# Patient Record
Sex: Male | Born: 1975 | Race: White | Hispanic: No | Marital: Married | Smoking: Current some day smoker
Health system: Southern US, Community
[De-identification: ages and names within clinical notes are randomized; demographics above are authoritative.]

## PROBLEM LIST (undated history)

## (undated) HISTORY — PX: HERNIA REPAIR: SHX51

## (undated) HISTORY — PX: WISDOM TOOTH EXTRACTION: SHX21

---

## 2012-05-08 ENCOUNTER — Ambulatory Visit: Payer: Managed Care, Other (non HMO) | Admitting: Family Medicine

## 2012-05-08 DIAGNOSIS — J45909 Unspecified asthma, uncomplicated: Secondary | ICD-10-CM

## 2012-05-08 MED ORDER — ALBUTEROL SULFATE HFA 108 (90 BASE) MCG/ACT IN AERS: 2.0000 | INHALATION_SPRAY | Freq: Four times a day (QID) | RESPIRATORY_TRACT | Status: DC | PRN

## 2012-05-08 NOTE — Progress Notes (Signed)
  [facility]:  Office Visit  Chief Complaint:  Chief Complaint  Patient presents with  . Shortness of Breath    pt out of his emergency inhaler.  got very SOB after a run today    HPI: Colin Rodriguez is a 36 y.o. male who complains of  1 episode of SOB for  20 min after working out at gym today, he has sporadic SOB with exercise in the last week as well. Took zyrtec without relief. Also has a h/o asthma , has not been on meds since junior high school.   Past Medical History  Diagnosis Date  . Asthma    History reviewed. No pertinent past surgical history. History   Social History  . Marital Status: Married    Spouse Name: N/A    Number of Children: N/A  . Years of Education: N/A   Social History Main Topics  . Smoking status: Former Games developer  . Smokeless tobacco: None  . Alcohol Use: Yes  . Drug Use: No  . Sexually Active: None   Other Topics Concern  . None   Social History Narrative  . None   Family History  Problem Relation Age of Onset  . Hypertension Mother    No Known Allergies Prior to Admission medications   Medication Sig Start Date End Date Taking? Authorizing Provider  albuterol (PROVENTIL HFA;VENTOLIN HFA) 108 (90 BASE) MCG/ACT inhaler Inhale 2 puffs into the lungs every 6 (six) hours as needed.    Historical Provider, MD     ROS: The patient denies fevers, chills, night sweats, unintentional weight loss, chest pain, palpitations,  nausea, vomiting, abdominal pain, dysuria, hematuria, melena, numbness, weakness, or tingling.  All other systems have been reviewed and were otherwise negative with the exception of those mentioned in the HPI and as above.    PHYSICAL EXAM: Filed Vitals:   05/08/12 1301  BP: 112/70  Pulse: 73  Temp: 98 F (36.7 C)  Resp: 16   Filed Vitals:   05/08/12 1301  Height: 6' (1.829 m)  Weight: 195 lb (88.451 kg)   Body mass index is 26.45 kg/(m^2).  General: Alert, no acute distress HEENT:   Normocephalic, atraumatic, oropharynx patent.  Cardiovascular:  Regular rate and rhythm, no rubs murmurs or gallops.  No Carotid bruits, radial pulse intact. No pedal edema.  Respiratory: Clear to auscultation bilaterally.  No wheezes, rales, or rhonchi.  No cyanosis, no use of accessory musculature GI: No organomegaly, abdomen is soft and non-tender, positive bowel sounds.  No masses. Skin: No rashes. Neurologic: Facial musculature symmetric. Psychiatric: Patient is appropriate throughout our interaction. Lymphatic: No cervical lymphadenopathy Musculoskeletal: Gait intact.   LABS: No results found for this or any previous visit.   EKG/XRAY:   Primary read interpreted by Dr. Conley Rolls at [facility].   ASSESSMENT/PLAN: Encounter Diagnosis  Name Primary?  Marland Kitchen Asthma Yes   Rx: Albuterol INH prn when exercising Continue with OTC allergy medications. Recommend f/u if use INH more frequently  For consideration of maintenance meds    Trysten Berti PHUONG, DO 05/08/2012 2:23 PM

## 2012-09-18 ENCOUNTER — Emergency Department (HOSPITAL_COMMUNITY): Payer: Managed Care, Other (non HMO)

## 2012-09-18 ENCOUNTER — Encounter (HOSPITAL_COMMUNITY): Payer: Self-pay | Admitting: *Deleted

## 2012-09-18 ENCOUNTER — Emergency Department (HOSPITAL_COMMUNITY)
Admission: EM | Admit: 2012-09-18 | Discharge: 2012-09-18 | Disposition: A | Discharge status: Home / Self Care | Payer: Managed Care, Other (non HMO) | Attending: Emergency Medicine | Admitting: Emergency Medicine

## 2012-09-18 DIAGNOSIS — H538 Other visual disturbances: Secondary | ICD-10-CM | POA: Insufficient documentation

## 2012-09-18 DIAGNOSIS — R51 Headache: Secondary | ICD-10-CM | POA: Insufficient documentation

## 2012-09-18 DIAGNOSIS — J45909 Unspecified asthma, uncomplicated: Secondary | ICD-10-CM | POA: Insufficient documentation

## 2012-09-18 MED ORDER — ASPIRIN-ACETAMINOPHEN-CAFFEINE 250-250-65 MG PO TABS
1.0000 | ORAL_TABLET | Freq: Once | ORAL | Status: DC
  Filled 2012-09-18 (×2): qty 1

## 2012-09-18 NOTE — ED Notes (Signed)
Seen by MD and code stroke cancelled.  Patient was taken to CT from [facility].  Code stroke team present for same.

## 2012-09-18 NOTE — ED Notes (Signed)
Patient was at gym working out and had vision changes to right eye and headache on right side.  It then moved to a left sided headache.  No neuro deficits at EMS arrival

## 2012-09-18 NOTE — ED Provider Notes (Signed)
History     CSN: [id number]  Arrival date & time 09/18/12  1353   First MD Initiated Contact with Patient 09/18/12 1514      Chief Complaint  Patient presents with  . Headache    (Consider location/radiation/quality/duration/timing/severity/associated sxs/prior treatment) The history is provided by the patient.  Colin Rodriguez is a 36 y.o. male hx of asthma here with headache, blurry vision. He finished push ups around 12:15pm. Then he developed blurry vision on R eye that resolved. He also had gradual L sided headache that is improving. Headache is not sudden or thunderclap, not worse headache. No hx of migraines and no personal or family hx of aneurysms. He went to urgent care, and was sent here to r/o stroke. Stroke code canceled by stroke team and Dr. Rennis Chris prior to me seeing the patient.    Past Medical History  Diagnosis Date  . Asthma     History reviewed. No pertinent past surgical history.  Family History  Problem Relation Age of Onset  . Hypertension Mother     History  Substance Use Topics  . Smoking status: Former Games developer  . Smokeless tobacco: Not on file  . Alcohol Use: Yes      Review of Systems  HENT:       Blurry vision  Neurological: Positive for headaches.  All other systems reviewed and are negative.    Allergies  Review of patient's allergies indicates no known allergies.  Home Medications   Current Outpatient Rx  Name Route Sig Dispense Refill  . ALBUTEROL SULFATE HFA 108 (90 BASE) MCG/ACT IN AERS Inhalation Inhale 2 puffs into the lungs every 6 (six) hours as needed. 1 Inhaler 5  . CYCLOBENZAPRINE HCL 10 MG PO TABS Oral Take 10 mg by mouth daily as needed. For pain    . IBUPROFEN 200 MG PO TABS Oral Take 600 mg by mouth every 6 (six) hours as needed. For pain      BP 109/69  Pulse 53  Temp 98.3 F (36.8 C) (Oral)  Resp 16  SpO2 98%  Physical Exam  Nursing note and vitals reviewed. Constitutional: He is oriented to  person, place, and time. He appears well-developed and well-nourished.       NAD, calm  HENT:  Head: Normocephalic.  Mouth/Throat: Oropharynx is clear and moist.  Eyes: Conjunctivae normal are normal. Pupils are equal, round, and reactive to light.       Vision 20/20 bilaterally  Neck: Normal range of motion. Neck supple.  Cardiovascular: Normal rate.   Pulmonary/Chest: Effort normal and breath sounds normal.  Abdominal: Soft. Bowel sounds are normal.  Musculoskeletal: Normal range of motion. He exhibits no edema.  Neurological: He is alert and oriented to person, place, and time. No cranial nerve deficit. Coordination normal.       No pronator drift, nl speech, nl finger to nose  Skin: Skin is warm and dry.  Psychiatric: He has a normal mood and affect. His behavior is normal. Judgment and thought content normal.    ED Course  Procedures (including critical care time)  Labs Reviewed - No data to display Ct Head Wo Contrast  09/18/2012  *RADIOLOGY REPORT*  Clinical Data: Code stroke, vision changes, headache  CT HEAD WITHOUT CONTRAST  Technique:  Contiguous axial images were obtained from the base of the skull through the vertex without contrast.  Comparison: None.  Findings: No skull fracture is noted.  No intracranial hemorrhage, mass effect or midline shift.  Paranasal sinuses and mastoid air cells are unremarkable.  No definite acute cortical infarction.  No mass lesion is noted on this unenhanced scan.  The gray and white matter differentiation is preserved.  No intra or extra-axial fluid collection.  IMPRESSION:  No acute intracranial abnormality.  No definite acute cortical infarction.  I discussed with Dr.Jacubowitz   Original Report Authenticated By: Natasha Mead, M.D.      No diagnosis found.    MDM  Colin Rodriguez is a 36 y.o. male here with HA and blurry vision today. Patient is low risk for [facility] or aneurysms or stroke. He is likely to have complicated migraine now only has  mild headache and only wants PO meds. CT head showed no infarct or bleed. I counseled patient to take excedrin headache and follow up with his doctor. Return precautions given.       Richardean Canal, MD 09/18/12 1536

## 2012-09-21 ENCOUNTER — Other Ambulatory Visit (HOSPITAL_COMMUNITY): Payer: Self-pay | Admitting: Cardiology

## 2012-12-11 ENCOUNTER — Ambulatory Visit (INDEPENDENT_AMBULATORY_CARE_PROVIDER_SITE_OTHER): Payer: Managed Care, Other (non HMO) | Admitting: Emergency Medicine

## 2012-12-11 VITALS — BP 110/70 | HR 60 | Temp 97.6°F | Resp 16 | Ht 72.0 in | Wt 177.8 lb

## 2012-12-11 DIAGNOSIS — Z0000 Encounter for general adult medical examination without abnormal findings: Secondary | ICD-10-CM

## 2012-12-11 DIAGNOSIS — J45909 Unspecified asthma, uncomplicated: Secondary | ICD-10-CM

## 2012-12-11 DIAGNOSIS — E559 Vitamin D deficiency, unspecified: Secondary | ICD-10-CM

## 2012-12-11 LAB — POCT URINALYSIS DIPSTICK
Bilirubin, UA: NEGATIVE
Glucose, UA: NEGATIVE
Ketones, UA: NEGATIVE
Protein, UA: NEGATIVE
Spec Grav, UA: >=1.03

## 2012-12-11 LAB — COMPREHENSIVE METABOLIC PANEL
Albumin: 5.2 g/dL (ref 3.5–5.2)
CO2: 27 mEq/L (ref 19–32)
Calcium: 9.8 mg/dL (ref 8.4–10.5)
Chloride: 104 mEq/L (ref 96–112)
Glucose, Bld: 76 mg/dL (ref 70–99)
Potassium: 4.3 mEq/L (ref 3.5–5.3)
Sodium: 141 mEq/L (ref 135–145)
Total Bilirubin: 1 mg/dL (ref 0.3–1.2)
Total Protein: 7.4 g/dL (ref 6.0–8.3)

## 2012-12-11 LAB — POCT CBC
Hemoglobin: 16.3 g/dL (ref 14.1–18.1)
Lymph, poc: 2.2 (ref 0.6–3.4)
MCH, POC: 29.6 pg (ref 27–31.2)
MCHC: 31.6 g/dL — AB (ref 31.8–35.4)
MID (cbc): 0.6 (ref 0–0.9)
MPV: 10.5 fL (ref 0–99.8)
POC Granulocyte: 4 (ref 2–6.9)
POC LYMPH PERCENT: 32 %L (ref 10–50)
POC MID %: 9.4 %M (ref 0–12)
RDW, POC: 13.3 %
WBC: 6.8 10*3/uL (ref 4.6–10.2)

## 2012-12-11 LAB — POCT UA - MICROSCOPIC ONLY
Casts, Ur, LPF, POC: NEGATIVE
Mucus, UA: NEGATIVE
WBC, Ur, HPF, POC: NEGATIVE
Yeast, UA: NEGATIVE

## 2012-12-11 LAB — TSH: TSH: 1.654 u[IU]/mL (ref 0.350–4.500)

## 2012-12-11 LAB — LIPID PANEL: LDL Cholesterol: 92 mg/dL (ref 0–99)

## 2012-12-11 NOTE — Progress Notes (Signed)
[facility] [address], [place] [address] [phone]  Date:  12/11/2012   Name:  Colin Rodriguez   DOB:  12-Apr-1976   MRN:  [id number]  PCP:  Tally Due, MD    Chief Complaint: Annual Exam   History of Present Illness:  Colin Rodriguez is a 36 y.o. very pleasant male patient who presents with the following:  For general physical exam and labs.  History of asthma and has not used an inhaler since junior high school until recently.  Experiences wheezing with exercise rarely and more commonly while experiencing asthma symptoms.  Denies current asthma, wheezing or shortness of breath.  No allergy or URI symptoms.  Recent dietary loss of 25 pounds.  Began to workout regularly and run  There is no problem list on file for this patient.   Past Medical History  Diagnosis Date  . Asthma     No past surgical history on file.  History  Substance Use Topics  . Smoking status: Former Games developer  . Smokeless tobacco: Not on file  . Alcohol Use: Yes    Family History  Problem Relation Age of Onset  . Hypertension Mother   . Cancer Maternal Grandmother     No Known Allergies  Medication list has been reviewed and updated.  Current Outpatient Prescriptions on File Prior to Visit  Medication Sig Dispense Refill  . albuterol (PROVENTIL HFA;VENTOLIN HFA) 108 (90 BASE) MCG/ACT inhaler Inhale 2 puffs into the lungs every 6 (six) hours as needed.  1 Inhaler  5  . cyclobenzaprine (FLEXERIL) 10 MG tablet Take 10 mg by mouth daily as needed. For pain      . ibuprofen (ADVIL,MOTRIN) 200 MG tablet Take 600 mg by mouth every 6 (six) hours as needed. For pain        Review of Systems:  As per HPI, otherwise negative.    Physical Examination: Filed Vitals:   12/11/12 0822  BP: 110/70  Pulse: 60  Temp: 97.6 F (36.4 C)  Resp: 16   Filed Vitals:   12/11/12 0822  Height: 6' (1.829 m)  Weight: 177 lb 12.8 oz (80.65 kg)   Body mass index is  24.11 kg/(m^2). Ideal Body Weight: Weight in (lb) to have BMI = 25: 183.9   GEN: WDWN, NAD, Non-toxic, A & O x 3  No rash or sepsis or shortness of breath HEENT: Atraumatic, Normocephalic. Neck supple. No masses, No LAD.  Oropharynx and floor of mouth negative Ears and Nose: No external deformity.  TM negative Neck:  No masses or thyromegaly CV: RRR, No M/G/R. No JVD. No thrill. No extra heart sounds. PULM: CTA B, no wheezes, crackles, rhonchi. No retractions. No resp. distress. No accessory muscle use. ABD: S, NT, ND, +BS. No rebound. No HSM. EXTR: No c/c/e NEURO Normal gait.  PSYCH: Normally interactive. Conversant. Not depressed or anxious appearing.  Calm demeanor.    Assessment and Plan: Wellness screening Labs  Follow up as needed  Carmelina Dane, MD  Results for orders placed in visit on 12/11/12  POCT CBC      Component Value Range   WBC 6.8  4.6 - 10.2 K/uL   Lymph, poc 2.2  0.6 - 3.4   POC LYMPH PERCENT 32.0  10 - 50 %L   MID (cbc) 0.6  0 - 0.9   POC MID % 9.4  0 - 12 %M   POC Granulocyte 4.0  2 - 6.9  Granulocyte percent 58.6  37 - 80 %G   RBC 5.50  4.69 - 6.13 M/uL   Hemoglobin 16.3  14.1 - 18.1 g/dL   HCT, POC 40.9  81.1 - 53.7 %   MCV 93.8  80 - 97 fL   MCH, POC 29.6  27 - 31.2 pg   MCHC 31.6 (*) 31.8 - 35.4 g/dL   RDW, POC 91.4     Platelet Count, POC 289  142 - 424 K/uL   MPV 10.5  0 - 99.8 fL  POCT UA - MICROSCOPIC ONLY      Component Value Range   WBC, Ur, HPF, POC neg     RBC, urine, microscopic neg     Bacteria, U Microscopic neg     Mucus, UA neg     Epithelial cells, urine per micros neg     Crystals, Ur, HPF, POC neg     Casts, Ur, LPF, POC neg     Yeast, UA neg    POCT URINALYSIS DIPSTICK      Component Value Range   Color, UA yellow     Clarity, UA clear     Glucose, UA neg     Bilirubin, UA neg     Ketones, UA neg     Spec Grav, UA >=1.030     Blood, UA neg     pH, UA 5.5     Protein, UA neg     Urobilinogen, UA 0.2      Nitrite, UA neg     Leukocytes, UA Negative    COMPREHENSIVE METABOLIC PANEL      Component Value Range   Sodium 141  135 - 145 mEq/L   Potassium 4.3  3.5 - 5.3 mEq/L   Chloride 104  96 - 112 mEq/L   CO2 27  19 - 32 mEq/L   Glucose, Bld 76  70 - 99 mg/dL   BUN 23  6 - 23 mg/dL   Creat 7.82  9.56 - 2.13 mg/dL   Total Bilirubin 1.0  0.3 - 1.2 mg/dL   Alkaline Phosphatase 93  39 - 117 U/L   AST 22  0 - 37 U/L   ALT 19  0 - 53 U/L   Total Protein 7.4  6.0 - 8.3 g/dL   Albumin 5.2  3.5 - 5.2 g/dL   Calcium 9.8  8.4 - 08.6 mg/dL  LIPID PANEL      Component Value Range   Cholesterol 164  0 - 200 mg/dL   Triglycerides 56  <578 mg/dL   HDL 61  >46 mg/dL   Total CHOL/HDL Ratio 2.7     VLDL 11  0 - 40 mg/dL   LDL Cholesterol 92  0 - 99 mg/dL  TSH      Component Value Range   TSH 1.654  0.350 - 4.500 uIU/mL  VITAMIN D 25 HYDROXY      Component Value Range   Vit D, 25-Hydroxy 24 (*) 30 - 89 ng/mL

## 2012-12-12 DIAGNOSIS — J45909 Unspecified asthma, uncomplicated: Secondary | ICD-10-CM | POA: Insufficient documentation

## 2012-12-12 DIAGNOSIS — E559 Vitamin D deficiency, unspecified: Secondary | ICD-10-CM | POA: Insufficient documentation

## 2012-12-12 LAB — VITAMIN D 25 HYDROXY (VIT D DEFICIENCY, FRACTURES): Vit D, 25-Hydroxy: 24 ng/mL — ABNORMAL LOW (ref 30–89)

## 2012-12-12 MED ORDER — ERGOCALCIFEROL 1.25 MG (50000 UT) PO CAPS: 50000.0000 [IU] | ORAL_CAPSULE | ORAL | Status: DC

## 2013-07-07 ENCOUNTER — Ambulatory Visit (INDEPENDENT_AMBULATORY_CARE_PROVIDER_SITE_OTHER): Payer: Managed Care, Other (non HMO) | Admitting: Family Medicine

## 2013-07-07 ENCOUNTER — Ambulatory Visit: Payer: Managed Care, Other (non HMO)

## 2013-07-07 VITALS — BP 110/80 | HR 60 | Temp 98.1°F | Resp 16 | Ht 72.0 in | Wt 184.0 lb

## 2013-07-07 DIAGNOSIS — R05 Cough: Secondary | ICD-10-CM

## 2013-07-07 DIAGNOSIS — J40 Bronchitis, not specified as acute or chronic: Secondary | ICD-10-CM

## 2013-07-07 MED ORDER — AZITHROMYCIN 250 MG PO TABS: ORAL_TABLET | ORAL | Status: DC

## 2013-07-07 MED ORDER — ALBUTEROL SULFATE HFA 108 (90 BASE) MCG/ACT IN AERS: 2.0000 | INHALATION_SPRAY | RESPIRATORY_TRACT | Status: DC | PRN

## 2013-07-07 MED ORDER — HYDROCODONE-HOMATROPINE 5-1.5 MG/5ML PO SYRP: ORAL_SOLUTION | ORAL | Status: DC

## 2013-07-07 NOTE — Progress Notes (Signed)
Subjective:    Patient ID: Colin Rodriguez, male    DOB: 1976-04-30, 37 y.o.   MRN: [id number]  Chief Complaint  Patient presents with  . Cough    productive * 1 week  . Fatigue  . Chills    HPI Colin Rodriguez is a 37 y.o. male presenting for cough, fatigue, chills for one week.  His cough is productive with green sputum which is worse in the morning.  He also feels feverish, has chills, and complains of joint pain.       He recalls having a cold 1 1/2 week ago that resolved.  It occurred immediately after having a choking episode with a cracker.  He is concerned that he inhaled this cracker into his lungs.  He also had a stomach bug over the week that went through his family - this has resolved.  He has been taking ibuprofen with some relief.    Cough productive of green sputum. No SOB or wheezing.   History of asthma.  Has never had an attack.  Has not used his albuterol inhaler in one year.  Recently quit smoking at the end of May.  He has quit "several times" in the past.  No sick contacts.  No known tick bites.    Patient is getting ready to go on a beach trip.  Prior to Admission medications   Medication Sig Start Date End Date Taking? Authorizing Provider  ibuprofen (ADVIL,MOTRIN) 200 MG tablet Take 600 mg by mouth every 6 (six) hours as needed. For pain   Yes Historical Provider, MD                cyclobenzaprine (FLEXERIL) 10 MG tablet Take 10 mg by mouth daily as needed. For pain    Historical Provider, MD  ergocalciferol (VITAMIN D2) 50000 UNITS capsule Take 1 capsule (50,000 Units total) by mouth once a week. 12/12/12   Phillips Odor, MD          No Known Allergies  Review of Systems  Constitutional: Positive for chills and fatigue. Negative for fever, appetite change and unexpected weight change.  HENT: Positive for sneezing. Negative for hearing loss, ear pain, congestion, sore throat, rhinorrhea, trouble swallowing, postnasal drip, sinus pressure and  tinnitus.   Respiratory: Positive for cough (productive, green sputum), shortness of breath and wheezing. Negative for chest tightness.   Cardiovascular: Negative for chest pain.  Gastrointestinal: Negative for nausea, vomiting, abdominal pain, diarrhea and constipation.  Musculoskeletal: Positive for myalgias and arthralgias. Negative for joint swelling.  Neurological: Negative for dizziness, light-headedness and headaches.      Objective:   Physical Exam Filed Vitals:   07/07/13 1208  BP: 110/80  Pulse: 60  Temp: 98.1 F (36.7 C)  TempSrc: Oral  Resp: 16  Height: 6' (1.829 m)  Weight: 184 lb (83.462 kg)  SpO2: 99%   General:  WDWN male in no acute distress. Skin:  Soft, warm skin with good turgor.  No bruising or lesions present.  HEENT:   Head - normocephalic, no visible or palpable masses.  Eyes - conjunctivae clear, sclera white, PERRL.    Ears - EACs clear.  TMs are translucent and mobile with some sclerosing present.  Hearing intact.   Nose - Patent.  Nasal mucosa is non-inflamed.  Septum midline.   Mouth/Throat - Mucous membranes are moist and pink.  No obvious periodontal disease.   No tonsillar hypertrophy or exudate.   Neck - supple without lymphadenopathy or thyromegaly .  Cardiovascular: S1 and S2 distinct with no murmurs, rubs or gallops. Respiratory:  CTA with no adventitious sounds.   CXR: [facility] reading (PRIMARY) by  Dr. Patsy Lager.  DG Chest 2 View; Future. Mild hyperinflation otherwise clear.      Assessment & Plan:  1. Cough 2. Bronchitis, not specified as acute or chronic  Discussed results of x-ray with patient.  Praised patient for smoking cessation and encouraged him to continue this time.  Keep hydrated.  Call office if symptoms do not steadily improve or any acute worsening.  Prescribe: - azithromycin (ZITHROMAX Z-PAK) 250 MG tablet; 2 tabs po first day, then 1 tab po next 4 days  Dispense: 6 tablet; Refill: 0 - HYDROcodone-homatropine (HYCODAN)  5-1.5 MG/5ML syrup; 1 TSP PO Q 4-6 HOURS PRN COUGH  Dispense: 120 mL; Refill: 0 - albuterol (PROVENTIL HFA;VENTOLIN HFA) 108 (90 BASE) MCG/ACT inhaler; Inhale 2 puffs into the lungs every 4 (four) hours as needed for wheezing.  Dispense: 1 Inhaler; Refill: 1   Seen and examined with PA student.   Eula Listen, PA-C 07/07/2013 5:10 PM

## 2013-10-19 ENCOUNTER — Ambulatory Visit: Payer: Managed Care, Other (non HMO) | Admitting: Internal Medicine

## 2013-10-19 ENCOUNTER — Ambulatory Visit: Payer: Managed Care, Other (non HMO)

## 2013-10-19 VITALS — BP 120/70 | HR 60 | Temp 97.8°F | Resp 18 | Ht 71.5 in | Wt 190.0 lb

## 2013-10-19 DIAGNOSIS — R079 Chest pain, unspecified: Secondary | ICD-10-CM

## 2013-10-19 DIAGNOSIS — Z23 Encounter for immunization: Secondary | ICD-10-CM

## 2013-10-19 LAB — POCT CBC
HCT, POC: 46.6 % (ref 43.5–53.7)
Hemoglobin: 15 g/dL (ref 14.1–18.1)
Lymph, poc: 2.1 (ref 0.6–3.4)
MCH, POC: 30.4 pg (ref 27–31.2)
MCHC: 32.2 g/dL (ref 31.8–35.4)
WBC: 6.2 10*3/uL (ref 4.6–10.2)

## 2013-10-19 LAB — POCT SEDIMENTATION RATE: POCT SED RATE: 3 mm/hr (ref 0–22)

## 2013-10-19 MED ORDER — GI COCKTAIL ~~LOC~~: 30.0000 mL | Freq: Once | ORAL | Status: AC

## 2013-10-19 MED ORDER — OMEPRAZOLE 40 MG PO CPDR: 40.0000 mg | DELAYED_RELEASE_CAPSULE | Freq: Every day | ORAL | Status: DC

## 2013-10-19 NOTE — Progress Notes (Signed)
  Subjective:    Patient ID: Colin Rodriguez, male    DOB: November 21, 1976, 37 y.o.   MRN: [id number]  HPI Pt here for chest pain that started 2 weeks ago on/off. Pain more on the right side, but have moved to the left side. Patient states he has been feeling like he is fighting a virus and has had a headache for 3 days. He has taken Ibuprofen and excedrin for the headaches. Had body aches and fever last week and his wife has it this week. Exercises a lot and the chest pain was no worse during exercise, no diaphoresis, no weakness or syncope. No hx for gerd. No fhx of CAD  Review of Systems healthy    Objective:   Physical Exam  Vitals reviewed. Constitutional: He is oriented to person, place, and time. He appears well-developed and well-nourished. No distress.  HENT:  Head: Normocephalic.  Right Ear: External ear normal.  Left Ear: External ear normal.  Nose: Nose normal.  Mouth/Throat: Oropharynx is clear and moist.  Eyes: Conjunctivae and EOM are normal. Pupils are equal, round, and reactive to light. No scleral icterus.  Neck: Normal range of motion. Neck supple. No tracheal deviation present. No thyromegaly present.  Cardiovascular: Normal rate, normal heart sounds and intact distal pulses.   Pulmonary/Chest: Effort normal and breath sounds normal.  Abdominal: Soft. He exhibits no mass. There is no tenderness.  Musculoskeletal: Normal range of motion.  Neurological: He is alert and oriented to person, place, and time. No cranial nerve deficit. He exhibits normal muscle tone.  Psychiatric: His speech is normal and behavior is normal. Thought content normal. His mood appears anxious. Cognition and memory are normal.   EKG bradycardia/ no ischemia/athletic heart  [facility] reading (PRIMARY) by  Dr Perrin Maltese normal cxr       Assessment & Plan:  Chest pain/Quit smoking Hold nsaids Trial prilosec/Tylenol for pain

## 2013-10-19 NOTE — Patient Instructions (Addendum)
Influenza Vaccine (Flu Vaccine, Inactivated) 2013 2014 What You Need to Know WHY GET VACCINATED?  Influenza ("flu") is a contagious disease that spreads around the [place] every winter, usually between October and May.  Flu is caused by the influenza virus, and can be spread by coughing, sneezing, and close contact.  Anyone can get flu, but the risk of getting flu is highest among children. Symptoms come on suddenly and may last several days. They can include:  Fever or chills.  Sore throat.  Muscle aches.  Fatigue.  Cough.  Headache.  Runny or stuffy nose. Flu can make some people much sicker than others. These people include young children, people 65 and older, pregnant women, and people with certain health conditions such as heart, lung or kidney disease, or a weakened immune system. Flu vaccine is especially important for these people, and anyone in close contact with them. Flu can also lead to pneumonia, and make existing medical conditions worse. It can cause diarrhea and seizures in children. Each year thousands of people in the [place] die from flu, and many more are hospitalized. Flu vaccine is the best protection we have from flu and its complications. Flu vaccine also helps prevent spreading flu from person to person. INACTIVATED FLU VACCINE There are 2 types of influenza vaccine:  You are getting an inactivated flu vaccine, which does not contain any live influenza virus. It is given by injection with a needle, and often called the "flu shot."  A different live, attenuated (weakened) influenza vaccine is sprayed into the nostrils. This vaccine is described in a separate Vaccine Information Statement. Flu vaccine is recommended every year. Children 6 months through 8 years of age should get 2 doses the first year they get vaccinated. Flu viruses are always changing. Each year's flu vaccine is made to protect from viruses that are most likely to cause disease  that year. While flu vaccine cannot prevent all cases of flu, it is our best defense against the disease. Inactivated flu vaccine protects against 3 or 4 different influenza viruses. It takes about 2 weeks for protection to develop after the vaccination, and protection lasts several months to a year. Some illnesses that are not caused by influenza virus are often mistaken for flu. Flu vaccine will not prevent these illnesses. It can only prevent influenza. A "high-dose" flu vaccine is available for people 65 years of age and older. The person giving you the vaccine can tell you more about it. Some inactivated flu vaccine contains a very small amount of a mercury-based preservative called thimerosal. Studies have shown that thimerosal in vaccines is not harmful, but flu vaccines that do not contain a preservative are available. SOME PEOPLE SHOULD NOT GET THIS VACCINE Tell the person who gives you the vaccine:  If you have any severe (life-threatening) allergies. If you ever had a life-threatening allergic reaction after a dose of flu vaccine, or have a severe allergy to any part of this vaccine, you may be advised not to get a dose. Most, but not all, types of flu vaccine contain a small amount of egg.  If you ever had Guillain Barr Syndrome (a severe paralyzing illness, also called GBS). Some people with a history of GBS should not get this vaccine. This should be discussed with your doctor.  If you are not feeling well. They might suggest waiting until you feel better. But you should come back. RISKS OF A VACCINE REACTION With a vaccine, like any medicine, there   is a chance of side effects. These are usually mild and go away on their own. Serious side effects are also possible, but are very rare. Inactivated flu vaccine does not contain live flu virus, sogetting flu from this vaccine is not possible. Brief fainting spells and related symptoms (such as jerking movements) can happen after any medical  procedure, including vaccination. Sitting or lying down for about 15 minutes after a vaccination can help prevent fainting and injuries caused by falls. Tell your doctor if you feel dizzy or lightheaded, or have vision changes or ringing in the ears. Mild problems following inactivated flu vaccine:  Soreness, redness, or swelling where the shot was given.  Hoarseness; sore, red or itchy eyes; or cough.  Fever.  Aches.  Headache.  Itching.  Fatigue. If these problems occur, they usually begin soon after the shot and last 1 or 2 days. Moderate problems following inactivated flu vaccine:  Young children who get inactivated flu vaccine and pneumococcal vaccine (PCV13) at the same time may be at increased risk for seizures caused by fever. Ask your doctor for more information. Tell your doctor if a child who is getting flu vaccine has ever had a seizure. Severe problems following inactivated flu vaccine:  A severe allergic reaction could occur after any vaccine (estimated less than 1 in a million doses).  There is a small possibility that inactivated flu vaccine could be associated with Guillan Barr Syndrome (GBS), no more than 1 or 2 cases per million people vaccinated. This is much lower than the risk of severe complications from flu, which can be prevented by flu vaccine. The safety of vaccines is always being monitored. For more information, visit: http://floyd.org/ WHAT IF THERE IS A SERIOUS REACTION? What should I look for?  Look for anything that concerns you, such as signs of a severe allergic reaction, very high fever, or behavior changes. Signs of a severe allergic reaction can include hives, swelling of the face and throat, difficulty breathing, a fast heartbeat, dizziness, and weakness. These would start a few minutes to a few hours after the vaccination. What should I do?  If you think it is a severe allergic reaction or other emergency that cannot wait, call [phone] 1 1   or get the person to the nearest hospital. Otherwise, call your doctor.  Afterward, the reaction should be reported to the Vaccine Adverse Event Reporting System (VAERS). Your doctor might file this report, or you can do it yourself through the VAERS website at www.vaers.LAgents.no, or by calling 1-(845)258-7315. VAERS is only for reporting reactions. They do not give medical advice. THE NATIONAL VACCINE INJURY COMPENSATION PROGRAM The National Vaccine Injury Compensation Program (VICP) is a federal program that was created to compensate people who may have been injured by certain vaccines. Persons who believe they may have been injured by a vaccine can learn about the program and about filing a claim by calling 1-740-691-9983 or visiting the VICP website at SpiritualWord.at HOW CAN I LEARN MORE?  Ask your doctor.  Call your local or state health department.  Contact the Centers for Disease Control and Prevention (CDC):  Call 920-604-2512 (1-800-CDC-INFO) or  Visit CDC's website at BiotechRoom.com.cy CDC Inactivated Influenza Vaccine Interim VIS (07/24/12) Document Released: 10/10/2006 Document Revised: 09/09/2012 Document Reviewed: 07/24/2012 [facility] Patient Information 2014 [place], [place]. Gastroesophageal Reflux Disease, Adult Gastroesophageal reflux disease (GERD) happens when acid from your stomach flows up into the esophagus. When acid comes in contact with the esophagus, the acid causes soreness (inflammation)  in the esophagus. Over time, GERD may create small holes (ulcers) in the lining of the esophagus. CAUSES   Increased body weight. This puts pressure on the stomach, making acid rise from the stomach into the esophagus.  Smoking. This increases acid production in the stomach.  Drinking alcohol. This causes decreased pressure in the lower esophageal sphincter (valve or ring of muscle between the esophagus and stomach), allowing acid from the stomach into the  esophagus.  Late evening meals and a full stomach. This increases pressure and acid production in the stomach.  A malformed lower esophageal sphincter. Sometimes, no cause is found. SYMPTOMS   Burning pain in the lower part of the mid-chest behind the breastbone and in the mid-stomach area. This may occur twice a week or more often.  Trouble swallowing.  Sore throat.  Dry cough.  Asthma-like symptoms including chest tightness, shortness of breath, or wheezing. DIAGNOSIS  Your caregiver may be able to diagnose GERD based on your symptoms. In some cases, X-rays and other tests may be done to check for complications or to check the condition of your stomach and esophagus. TREATMENT  Your caregiver may recommend over-the-counter or prescription medicines to help decrease acid production. Ask your caregiver before starting or adding any new medicines.  HOME CARE INSTRUCTIONS   Change the factors that you can control. Ask your caregiver for guidance concerning weight loss, quitting smoking, and alcohol consumption.  Avoid foods and drinks that make your symptoms worse, such as:  Caffeine or alcoholic drinks.  Chocolate.  Peppermint or mint flavorings.  Garlic and onions.  Spicy foods.  Citrus fruits, such as oranges, lemons, or limes.  Tomato-based foods such as sauce, chili, salsa, and pizza.  Fried and fatty foods.  Avoid lying down for the 3 hours prior to your bedtime or prior to taking a nap.  Eat small, frequent meals instead of large meals.  Wear loose-fitting clothing. Do not wear anything tight around your waist that causes pressure on your stomach.  Raise the head of your bed 6 to 8 inches with wood blocks to help you sleep. Extra pillows will not help.  Only take over-the-counter or prescription medicines for pain, discomfort, or fever as directed by your caregiver.  Do not take aspirin, ibuprofen, or other nonsteroidal anti-inflammatory drugs (NSAIDs). SEEK  IMMEDIATE MEDICAL CARE IF:   You have pain in your arms, neck, jaw, teeth, or back.  Your pain increases or changes in intensity or duration.  You develop nausea, vomiting, or sweating (diaphoresis).  You develop shortness of breath, or you faint.  Your vomit is green, yellow, black, or looks like coffee grounds or blood.  Your stool is red, bloody, or black. These symptoms could be signs of other problems, such as heart disease, gastric bleeding, or esophageal bleeding. MAKE SURE YOU:   Understand these instructions.  Will watch your condition.  Will get help right away if you are not doing well or get worse. Document Released: 09/25/2005 Document Revised: 03/09/2012 Document Reviewed: 07/05/2011 [facility] Patient Information 2014 [place], [place]. Smoking Cessation Quitting smoking is important to your health and has many advantages. However, it is not always easy to quit since nicotine is a very addictive drug. Often times, people try 3 times or more before being able to quit. This document explains the best ways for you to prepare to quit smoking. Quitting takes hard work and a lot of effort, but you can do it. ADVANTAGES OF QUITTING SMOKING  You will live  longer, feel better, and live better.  Your body will feel the impact of quitting smoking almost immediately.  Within 20 minutes, blood pressure decreases. Your pulse returns to its normal level.  After 8 hours, carbon monoxide levels in the blood return to normal. Your oxygen level increases.  After 24 hours, the chance of having a heart attack starts to decrease. Your breath, hair, and body stop smelling like smoke.  After 48 hours, damaged nerve endings begin to recover. Your sense of taste and smell improve.  After 72 hours, the body is virtually free of nicotine. Your bronchial tubes relax and breathing becomes easier.  After 2 to 12 weeks, lungs can hold more air. Exercise becomes easier and circulation  improves.  The risk of having a heart attack, stroke, cancer, or lung disease is greatly reduced.  After 1 year, the risk of coronary heart disease is cut in half.  After 5 years, the risk of stroke falls to the same as a nonsmoker.  After 10 years, the risk of lung cancer is cut in half and the risk of other cancers decreases significantly.  After 15 years, the risk of coronary heart disease drops, usually to the level of a nonsmoker.  If you are pregnant, quitting smoking will improve your chances of having a healthy baby.  The people you live with, especially any children, will be healthier.  You will have extra money to spend on things other than cigarettes. QUESTIONS TO THINK ABOUT BEFORE ATTEMPTING TO QUIT You may want to talk about your answers with your caregiver.  Why do you want to quit?  If you tried to quit in the past, what helped and what did not?  What will be the most difficult situations for you after you quit? How will you plan to handle them?  Who can help you through the tough times? Your family? Friends? A caregiver?  What pleasures do you get from smoking? What ways can you still get pleasure if you quit? Here are some questions to ask your caregiver:  How can you help me to be successful at quitting?  What medicine do you think would be best for me and how should I take it?  What should I do if I need more help?  What is smoking withdrawal like? How can I get information on withdrawal? GET READY  Set a quit date.  Change your environment by getting rid of all cigarettes, ashtrays, matches, and lighters in your home, car, or work. Do not let people smoke in your home.  Review your past attempts to quit. Think about what worked and what did not. GET SUPPORT AND ENCOURAGEMENT You have a better chance of being successful if you have help. You can get support in many ways.  Tell your family, friends, and co-workers that you are going to quit and need  their support. Ask them not to smoke around you.  Get individual, group, or telephone counseling and support. Programs are available at [employer] and health centers. Call your local health department for information about programs in your area.  Spiritual beliefs and practices may help some smokers quit.  Download a "quit meter" on your computer to keep track of quit statistics, such as how long you have gone without smoking, cigarettes not smoked, and money saved.  Get a self-help book about quitting smoking and staying off of tobacco. LEARN NEW SKILLS AND BEHAVIORS  Distract yourself from urges to smoke. Talk to someone, go for  a walk, or occupy your time with a task.  Change your normal routine. Take a different route to work. Drink tea instead of coffee. Eat breakfast in a different place.  Reduce your stress. Take a hot bath, exercise, or read a book.  Plan something enjoyable to do every day. Reward yourself for not smoking.  Explore interactive web-based programs that specialize in helping you quit. GET MEDICINE AND USE IT CORRECTLY Medicines can help you stop smoking and decrease the urge to smoke. Combining medicine with the above behavioral methods and support can greatly increase your chances of successfully quitting smoking.  Nicotine replacement therapy helps deliver nicotine to your body without the negative effects and risks of smoking. Nicotine replacement therapy includes nicotine gum, lozenges, inhalers, nasal sprays, and skin patches. Some may be available over-the-counter and others require a prescription.  Antidepressant medicine helps people abstain from smoking, but how this works is unknown. This medicine is available by prescription.  Nicotinic receptor partial agonist medicine simulates the effect of nicotine in your brain. This medicine is available by prescription. Ask your caregiver for advice about which medicines to use and how to use them based on  your health history. Your caregiver will tell you what side effects to look out for if you choose to be on a medicine or therapy. Carefully read the information on the package. Do not use any other product containing nicotine while using a nicotine replacement product.  RELAPSE OR DIFFICULT SITUATIONS Most relapses occur within the first 3 months after quitting. Do not be discouraged if you start smoking again. Remember, most people try several times before finally quitting. You may have symptoms of withdrawal because your body is used to nicotine. You may crave cigarettes, be irritable, feel very hungry, cough often, get headaches, or have difficulty concentrating. The withdrawal symptoms are only temporary. They are strongest when you first quit, but they will go away within 10 14 days. To reduce the chances of relapse, try to:  Avoid drinking alcohol. Drinking lowers your chances of successfully quitting.  Reduce the amount of caffeine you consume. Once you quit smoking, the amount of caffeine in your body increases and can give you symptoms, such as a rapid heartbeat, sweating, and anxiety.  Avoid smokers because they can make you want to smoke.  Do not let weight gain distract you. Many smokers will gain weight when they quit, usually less than 10 pounds. Eat a healthy diet and stay active. You can always lose the weight gained after you quit.  Find ways to improve your mood other than smoking. FOR MORE INFORMATION  www.smokefree.gov  Document Released: 12/10/2001 Document Revised: 06/16/2012 Document Reviewed: 03/26/2012 [facility] Patient Information 2014 [place], [place].

## 2013-12-23 IMAGING — CR DG CHEST 2V
3 series · 3 of 3 positions shown · non-contrast
Comparison: None.

CLINICAL DATA: Cough

CHEST - 2 VIEW

[PA (1 of 2)]
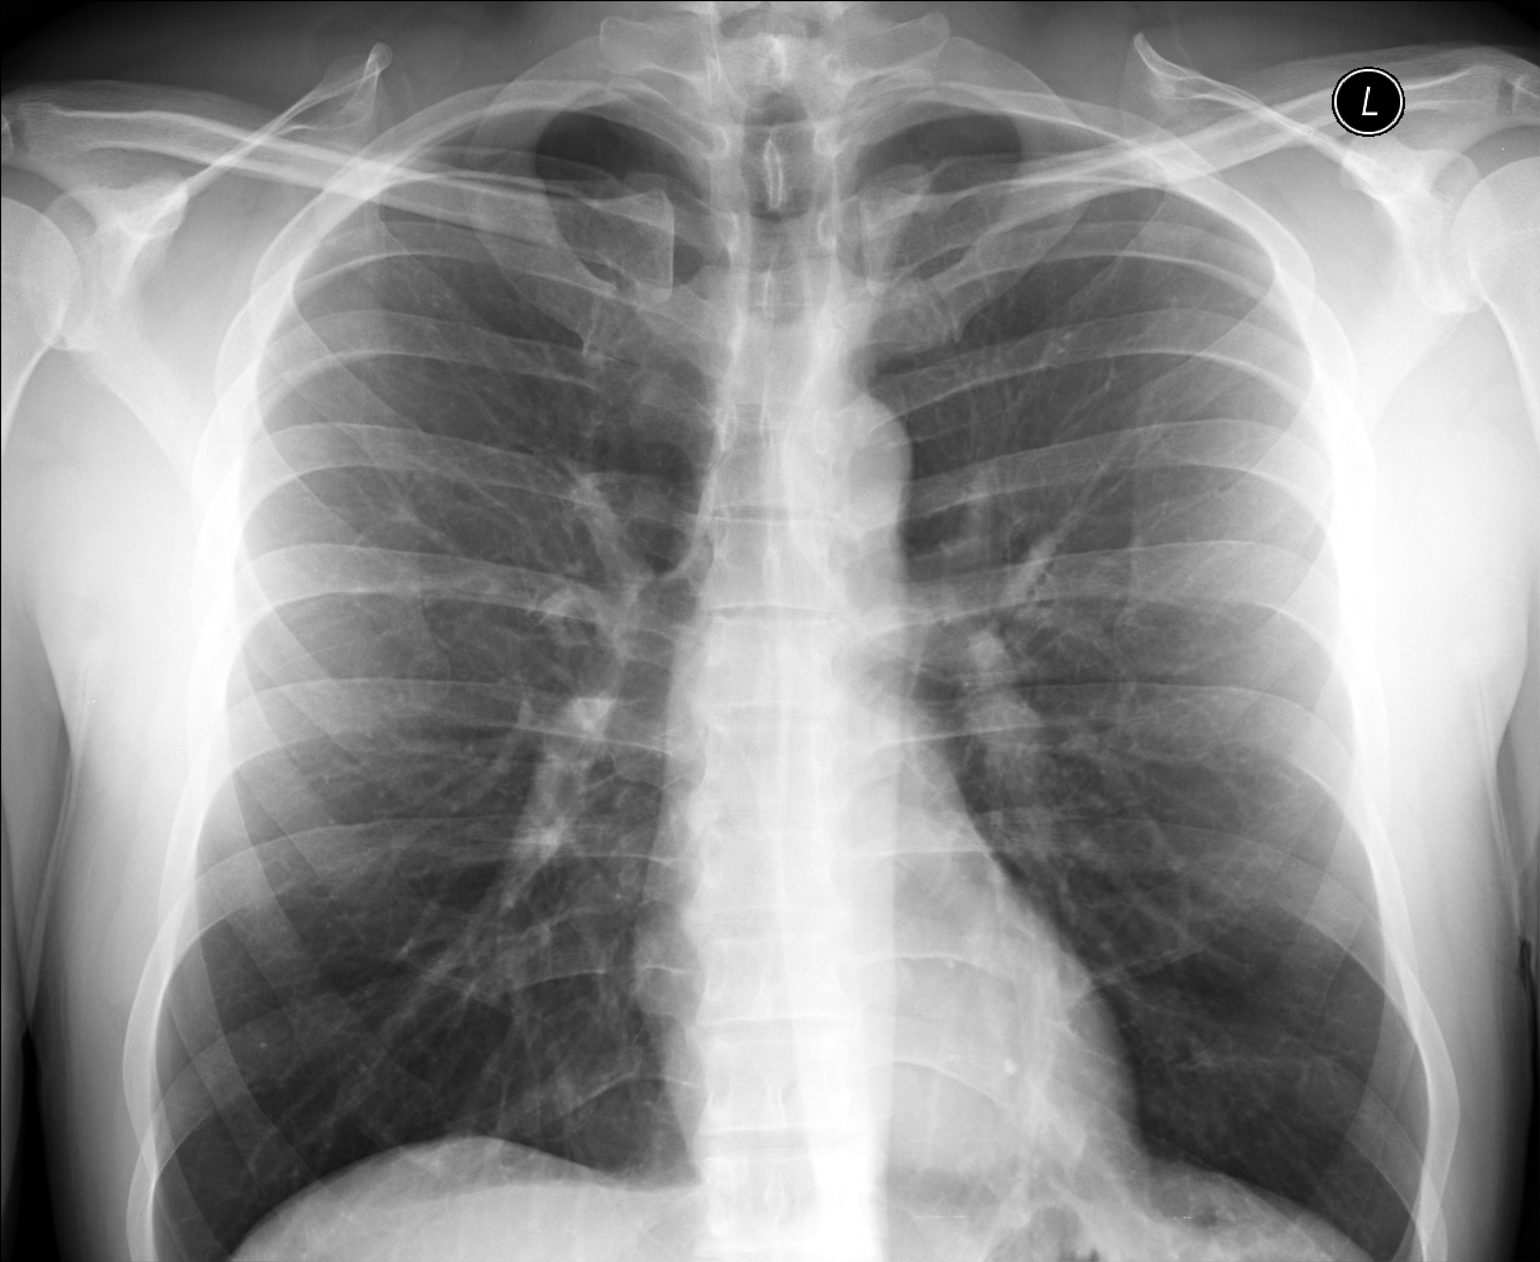

[lateral]
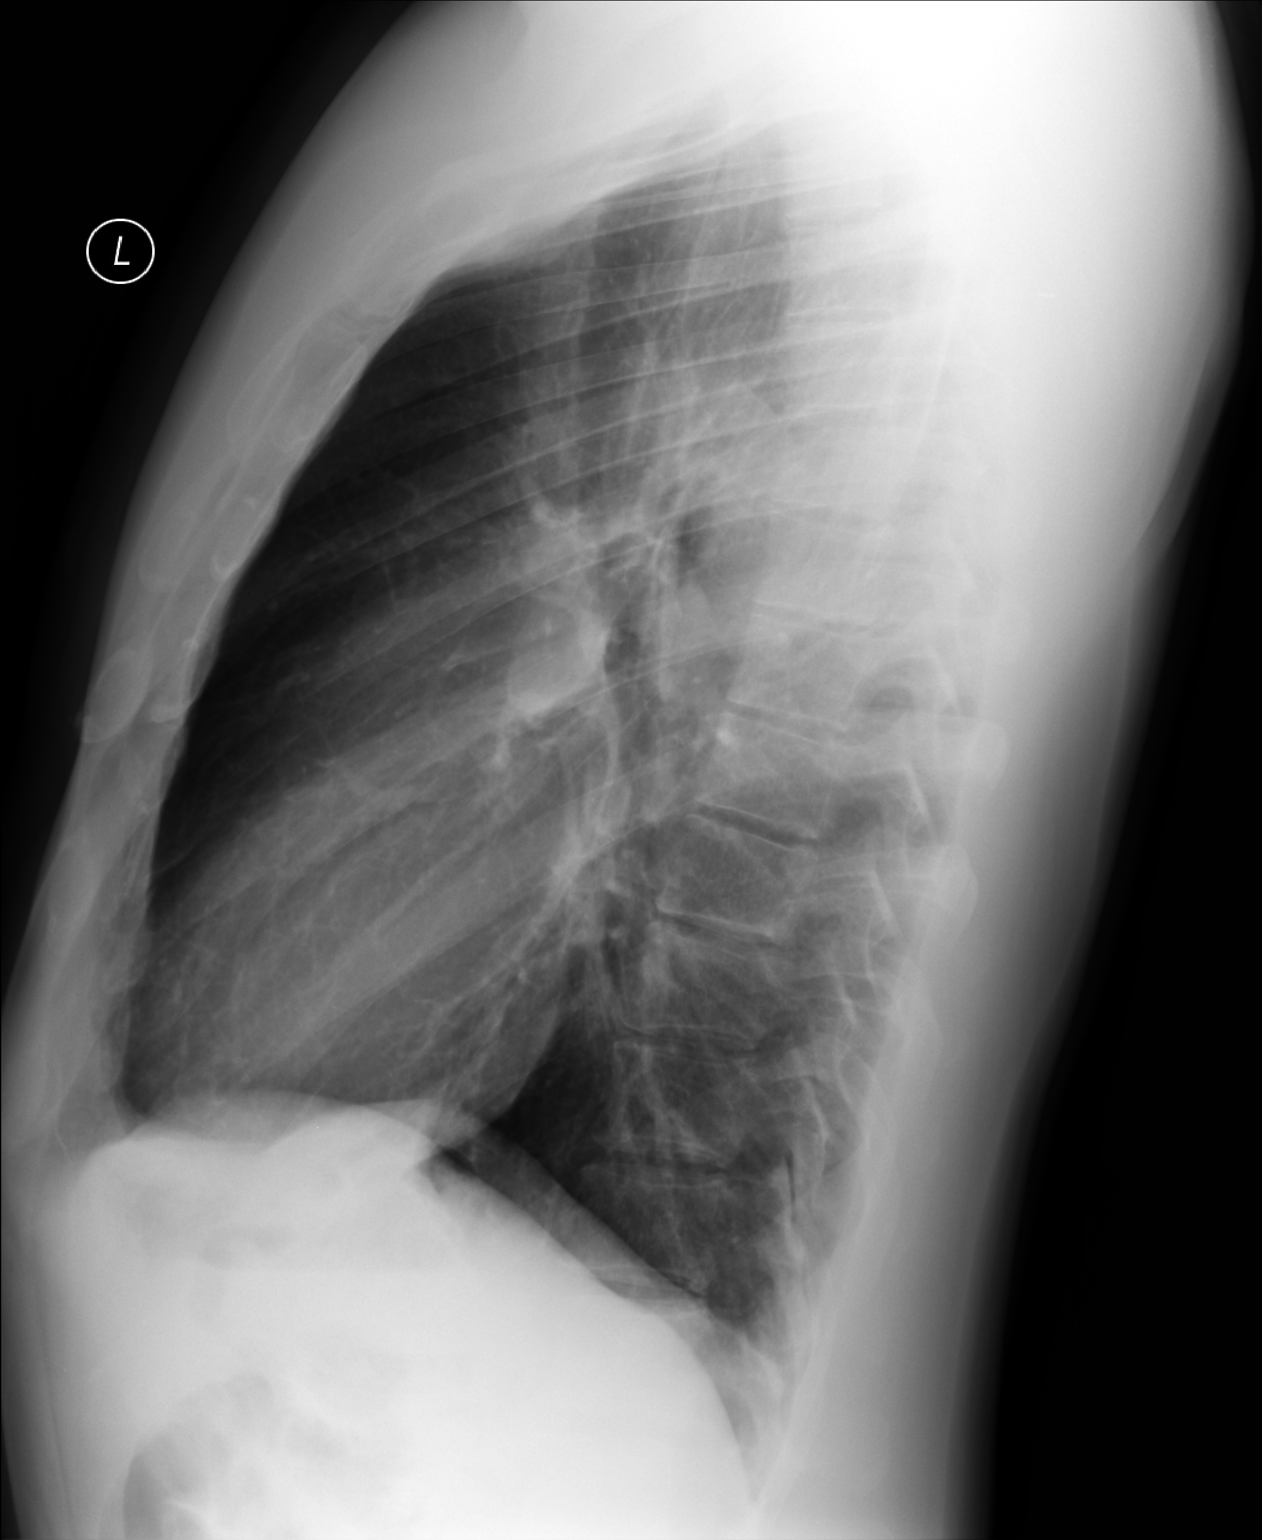

[PA (2 of 2)]
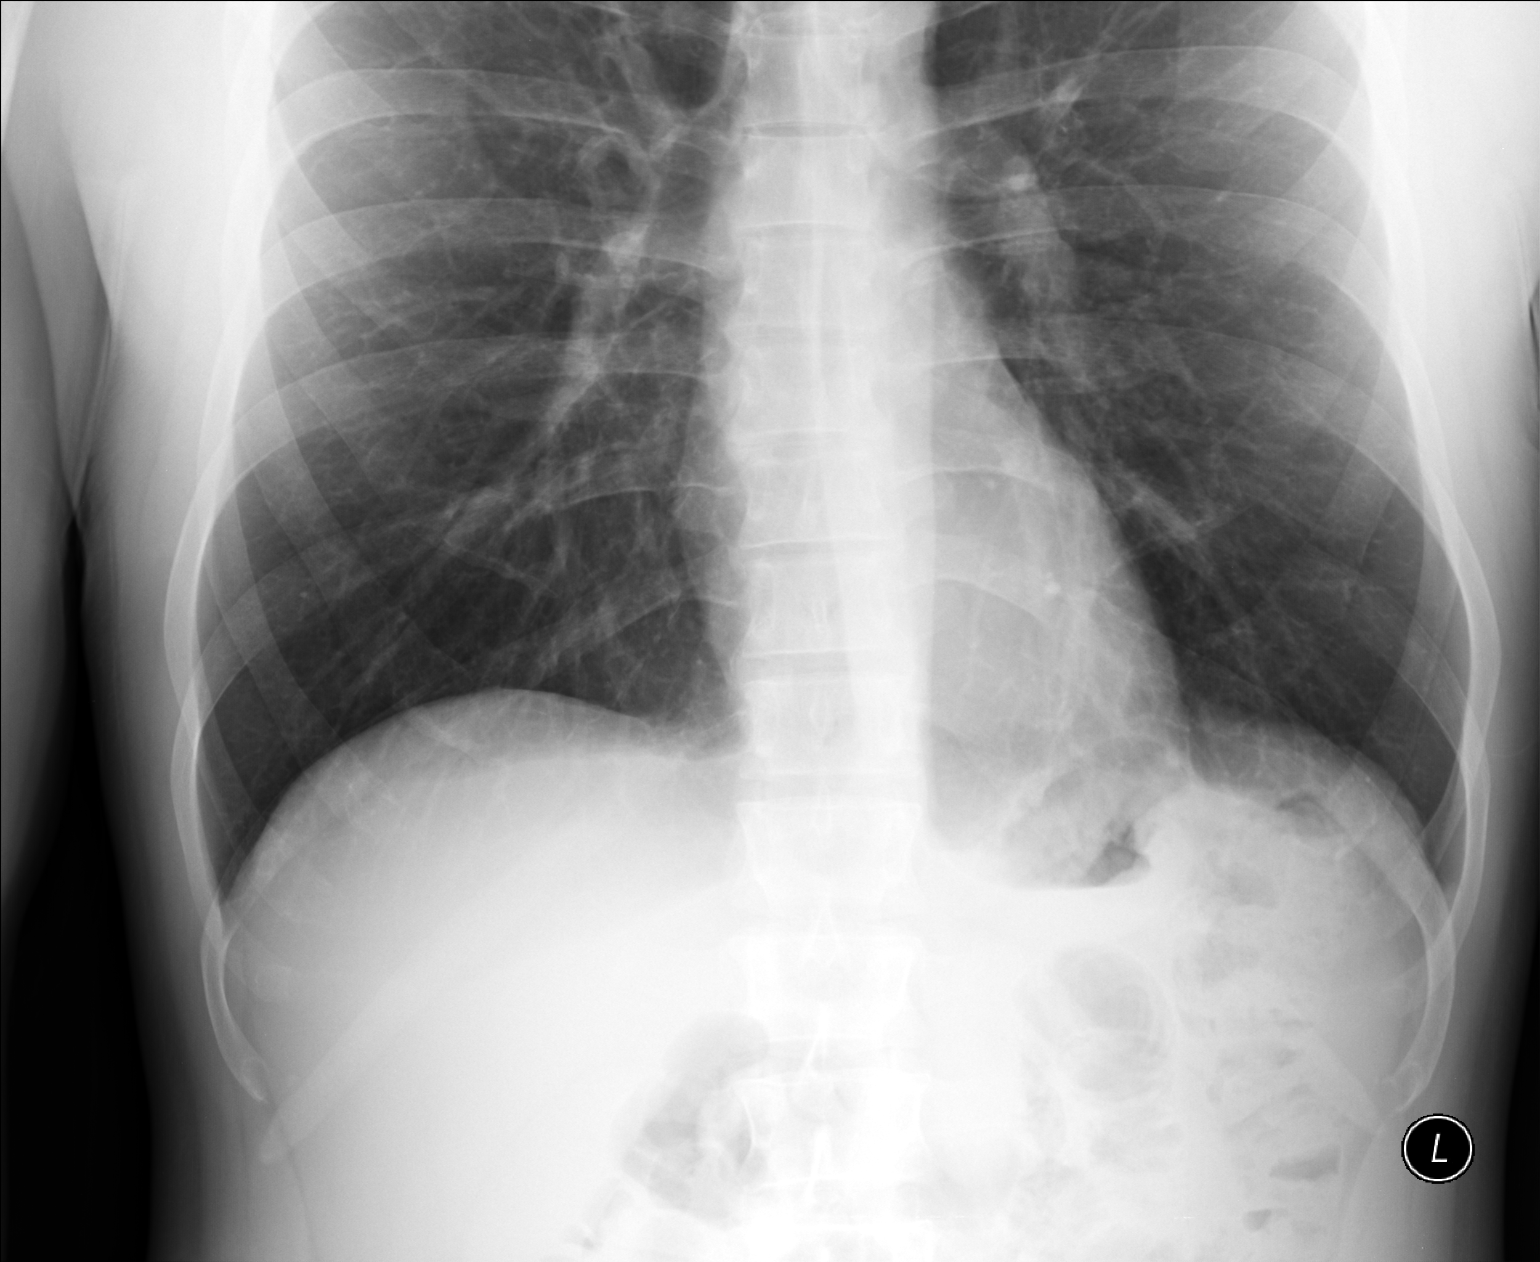

[3 of 3 positions shown; findings below may reference images not displayed]

FINDINGS: Cardiomediastinal silhouette is unremarkable.  Mild
hyperinflation.  No acute infiltrate or pleural effusion.  No
pulmonary edema.
IMPRESSION: No active disease.  Mild hyperinflation.

Clinically significant discrepancy from primary report, if
provided: None

## 2014-04-06 IMAGING — CR DG CHEST 2V
2 series · 2 of 2 positions shown · non-contrast
Comparison: 07/07/2013

CLINICAL DATA: Chest pain

EXAM:
CHEST  2 VIEW

[PA]
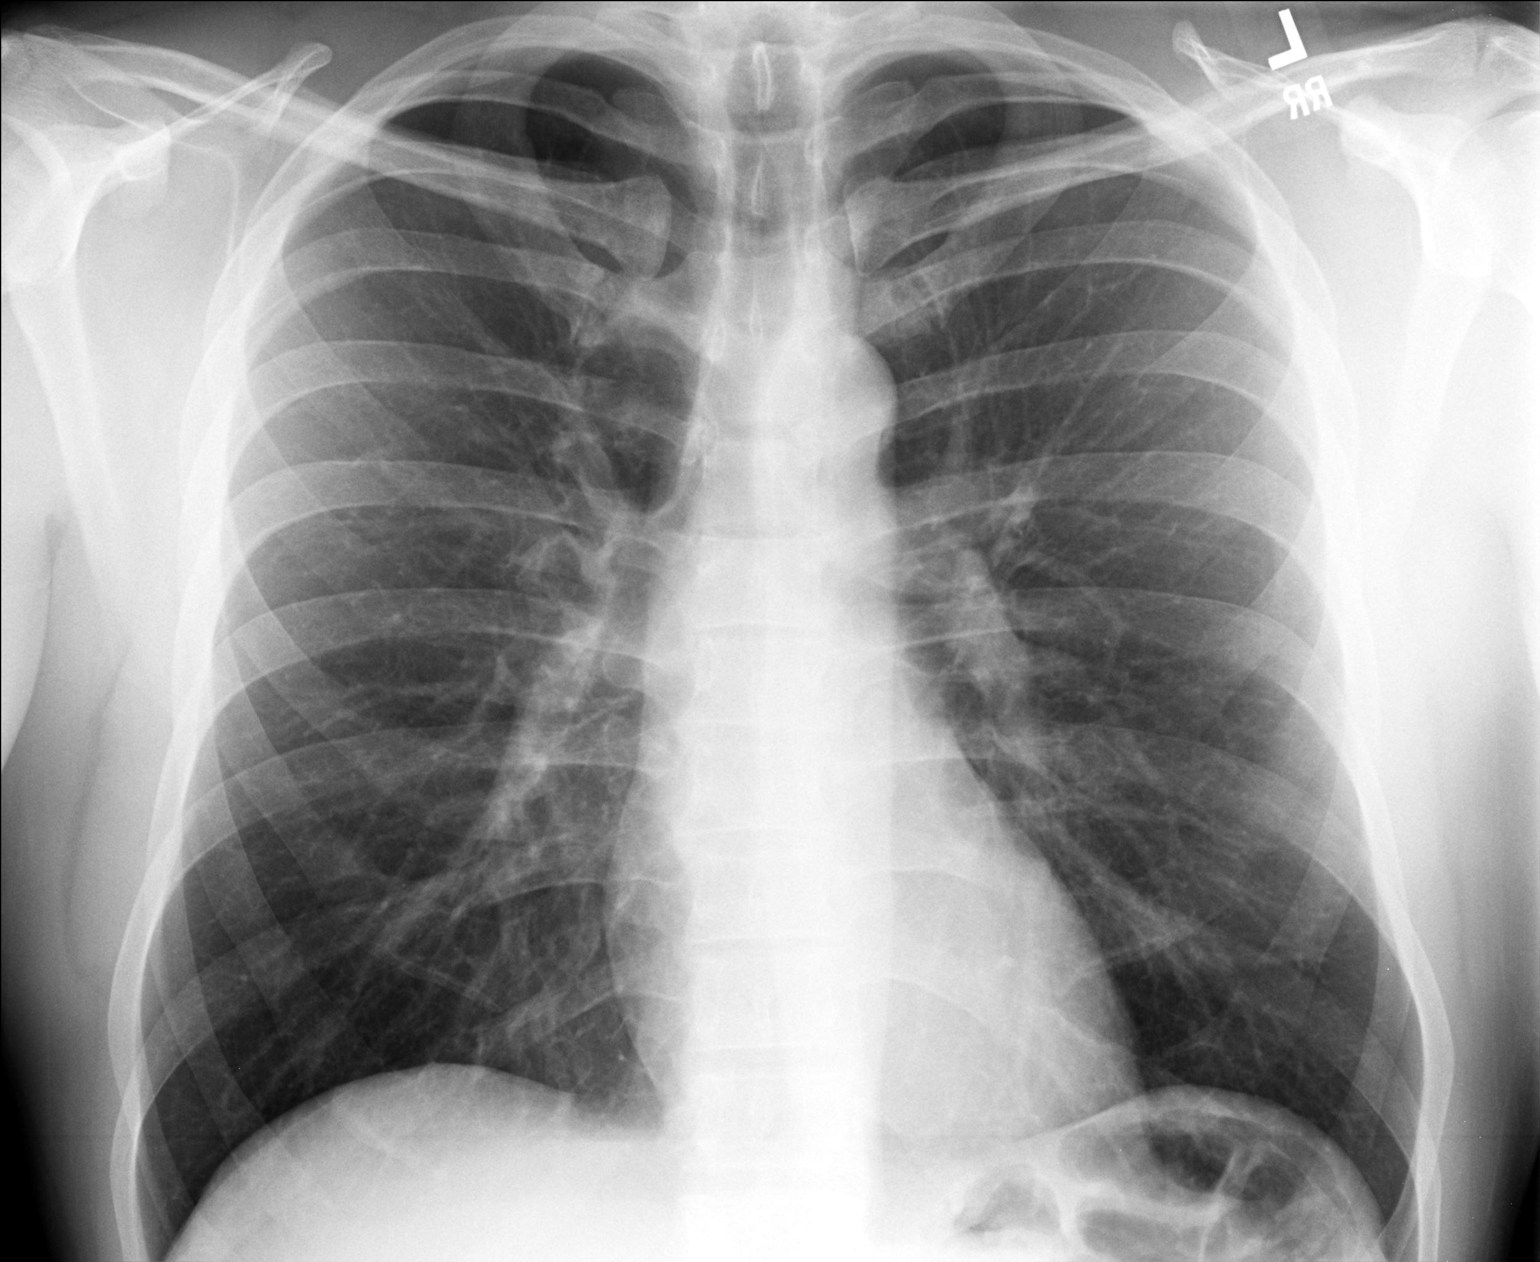

[lateral]
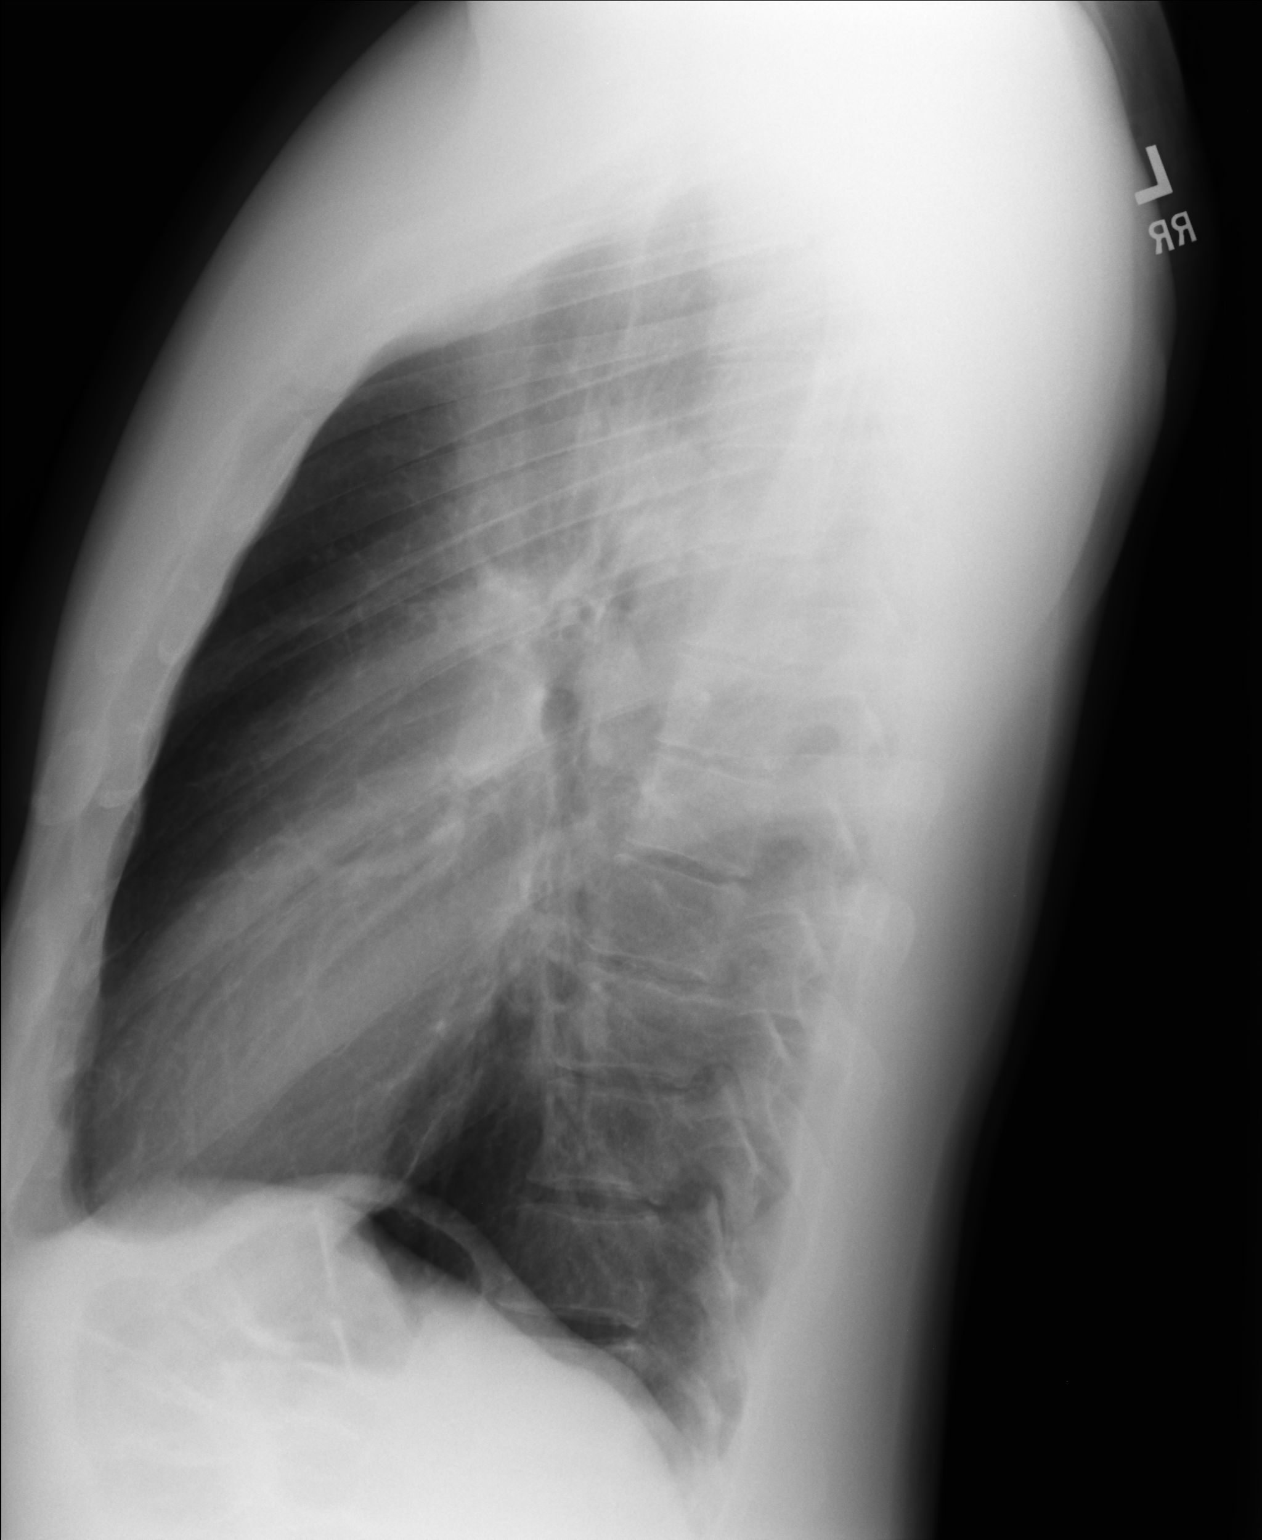

[2 of 2 positions shown; findings below may reference images not displayed]

FINDINGS: The heart size and mediastinal contours are within normal limits.
Both lungs are clear. The visualized skeletal structures are
unremarkable.
IMPRESSION: No active cardiopulmonary disease.

## 2014-09-21 ENCOUNTER — Encounter: Payer: Self-pay | Admitting: Family Medicine

## 2014-09-21 ENCOUNTER — Ambulatory Visit (INDEPENDENT_AMBULATORY_CARE_PROVIDER_SITE_OTHER): Payer: Managed Care, Other (non HMO) | Admitting: Family Medicine

## 2014-09-21 VITALS — BP 99/58 | HR 72 | Temp 98.0°F | Resp 16 | Ht 72.0 in | Wt 195.0 lb

## 2014-09-21 DIAGNOSIS — Z23 Encounter for immunization: Secondary | ICD-10-CM

## 2014-09-21 DIAGNOSIS — Z13220 Encounter for screening for lipoid disorders: Secondary | ICD-10-CM

## 2014-09-21 DIAGNOSIS — Z0000 Encounter for general adult medical examination without abnormal findings: Secondary | ICD-10-CM

## 2014-09-21 DIAGNOSIS — Z131 Encounter for screening for diabetes mellitus: Secondary | ICD-10-CM

## 2014-09-21 DIAGNOSIS — R5381 Other malaise: Secondary | ICD-10-CM

## 2014-09-21 DIAGNOSIS — R5383 Other fatigue: Secondary | ICD-10-CM

## 2014-09-21 DIAGNOSIS — E559 Vitamin D deficiency, unspecified: Secondary | ICD-10-CM

## 2014-09-21 LAB — CBC WITH DIFFERENTIAL/PLATELET
Basophils Absolute: 0 10*3/uL (ref 0.0–0.1)
Basophils Relative: 0 % (ref 0–1)
EOS ABS: 0.2 10*3/uL (ref 0.0–0.7)
Eosinophils Relative: 3 % (ref 0–5)
HCT: 48.9 % (ref 39.0–52.0)
HEMOGLOBIN: 16.3 g/dL (ref 13.0–17.0)
LYMPHS ABS: 1.9 10*3/uL (ref 0.7–4.0)
LYMPHS PCT: 32 % (ref 12–46)
MCH: 29.3 pg (ref 26.0–34.0)
MCHC: 33.3 g/dL (ref 30.0–36.0)
MCV: 87.9 fL (ref 78.0–100.0)
MONOS PCT: 9 % (ref 3–12)
Monocytes Absolute: 0.5 10*3/uL (ref 0.1–1.0)
NEUTROS PCT: 56 % (ref 43–77)
Neutro Abs: 3.3 10*3/uL (ref 1.7–7.7)
Platelets: 260 10*3/uL (ref 150–400)
RBC: 5.56 MIL/uL (ref 4.22–5.81)
RDW: 13.9 % (ref 11.5–15.5)
WBC: 5.9 10*3/uL (ref 4.0–10.5)

## 2014-09-21 LAB — POCT URINALYSIS DIPSTICK
Bilirubin, UA: NEGATIVE
Blood, UA: NEGATIVE
Glucose, UA: NEGATIVE
Ketones, UA: NEGATIVE
LEUKOCYTES UA: NEGATIVE
Nitrite, UA: NEGATIVE
PROTEIN UA: NEGATIVE
SPEC GRAV UA: 1.015
Urobilinogen, UA: 0.2
pH, UA: 5.5

## 2014-09-21 NOTE — Progress Notes (Signed)
Subjective:    Patient ID: Colin Rodriguez, male    DOB: July 03, 1976, 38 y.o.   MRN: [id number]  This chart was scribed for Ethelda Chick, MD by Gwenevere Abbot, ED scribe. This patient was seen in room Room/bed 22 and the patient's care was started at 4:01 PM.  HPI  HPI Comments:  Colin Rodriguez is a 38 y.o. male who presents to [facility] for complete physical exam. Pt's last physical was in 2013.  Pt denies back pain; however he does have history of disc issues. Pt reports that he does have a torn rotator cuff, but is treating it with therapy because he does not want surgery. Pt sees [facility].  Pt had a MRI of shoulder in past.   Immunizations and Exams: Meningitus vaccine: 2011 Flu vaccine: Pt accepts flu vaccine. Pt has never needed a colonoscopy Tetanus: Pt is not UTD, but accepts immunizations. Pt has 3 children and is especially concerned with getting the whooping cough immunization. Dental exam: Pt has his teeth cleaned every six months. Eye Exam: Pt visits eye doctor regularly. Pt denies glaucoma or cataracts.  Diet and Exercise: Pt used to smoke regularly, but quit 5 years ago. Pt reports that he does smoke socially now. Pt has approximately 4 to 5 drinks per week. Pt denies illicit drug use. Pt exercises 4 to 5 times a week. Pt has never passed out from exercise. Sleep: Pt reports that he occasionally snores, and that he does not wake up feeling refreshed in the morning. Pt does not nap, because he is unable to fall alseep.  He drinks a lot of coffee.  Family History:  Mother: 63 y.o. with HTN Father: 71 y.o, periodically has to have skin removed, but no melanoma. Siblings: Healthy with the exception of GERD.   Review of Systems  Constitutional: Positive for fatigue. Negative for fever, chills, diaphoresis, activity change, appetite change and unexpected weight change.  HENT: Negative for congestion, dental problem, drooling, ear discharge, ear pain, facial  swelling, hearing loss, mouth sores, nosebleeds, postnasal drip, rhinorrhea, sinus pressure, sneezing, sore throat, tinnitus, trouble swallowing and voice change.   Eyes: Negative for photophobia, pain, discharge, redness, itching and visual disturbance.  Respiratory: Negative for apnea, cough, choking, chest tightness, shortness of breath, wheezing and stridor.   Cardiovascular: Negative for chest pain, palpitations and leg swelling.  Gastrointestinal: Negative for nausea, vomiting, abdominal pain, diarrhea, constipation and blood in stool.  Endocrine: Negative for cold intolerance, heat intolerance, polydipsia, polyphagia and polyuria.  Genitourinary: Negative for dysuria, urgency, frequency, hematuria, flank pain, decreased urine volume, discharge, penile swelling, scrotal swelling, enuresis, difficulty urinating, genital sores, penile pain and testicular pain.  Musculoskeletal: Positive for arthralgias and back pain. Negative for gait problem, joint swelling, myalgias, neck pain and neck stiffness.  Skin: Negative for color change, pallor, rash and wound.  Allergic/Immunologic: Negative for environmental allergies, food allergies and immunocompromised state.  Neurological: Negative for dizziness, tremors, seizures, syncope, facial asymmetry, speech difficulty, weakness, light-headedness, numbness and headaches.  Hematological: Negative for adenopathy. Does not bruise/bleed easily.  Psychiatric/Behavioral: Negative for suicidal ideas, hallucinations, behavioral problems, confusion, sleep disturbance, self-injury, dysphoric mood, decreased concentration and agitation. The patient is not nervous/anxious and is not hyperactive.        Objective:   Physical Exam  Nursing note and vitals reviewed. Constitutional: He is oriented to person, place, and time. He appears well-developed and well-nourished. No distress.  HENT:  Head: Normocephalic and atraumatic.  Right Ear: External  ear normal.  Left  Ear: External ear normal.  Nose: Nose normal.  Mouth/Throat: Oropharynx is clear and moist.  Eyes: Conjunctivae and EOM are normal. Pupils are equal, round, and reactive to light.  Neck: Normal range of motion. Neck supple. Carotid bruit is not present. No thyromegaly present.  Cardiovascular: Normal rate, regular rhythm, normal heart sounds and intact distal pulses.  Exam reveals no gallop and no friction rub.   No murmur heard. Pulmonary/Chest: Effort normal and breath sounds normal. He has no wheezes. He has no rales.  Abdominal: Soft. Bowel sounds are normal. He exhibits no distension and no mass. There is no tenderness. There is no rebound and no guarding. Hernia confirmed negative in the right inguinal area and confirmed negative in the left inguinal area.  Genitourinary: Penis normal. Right testis shows no mass, no swelling and no tenderness. Left testis shows no mass, no swelling and no tenderness. Circumcised.  Musculoskeletal: Normal range of motion.       Right shoulder: Normal.       Left shoulder: Normal.       Cervical back: Normal.  Lymphadenopathy:    He has no cervical adenopathy.       Right: No inguinal adenopathy present.       Left: No inguinal adenopathy present.  Neurological: He is alert and oriented to person, place, and time. He has normal reflexes. No cranial nerve deficit. He exhibits normal muscle tone. Coordination normal.  Skin: Skin is warm and dry. No rash noted. He is not diaphoretic.  Psychiatric: He has a normal mood and affect. His behavior is normal. Judgment and thought content normal.    TDAP and INFLUENZA VACCINATIONS ADMINISTERED.    Assessment & Plan:  Need for prophylactic vaccination and inoculation against influenza - Plan: Flu Vaccine QUAD 36+ mos IM  Routine general medical examination at a health care facility - Plan: POCT urinalysis dipstick  Need for Tdap vaccination - Plan: Tdap vaccine greater than or equal to 7yo IM  Unspecified  vitamin D deficiency - Plan: Vit D  25 hydroxy (rtn osteoporosis monitoring)  Other malaise and fatigue - Plan: CBC with Differential, Comprehensive metabolic panel, TSH, Vit D  25 hydroxy (rtn osteoporosis monitoring), POCT urinalysis dipstick  Screening for diabetes mellitus - Plan: Comprehensive metabolic panel, Hemoglobin A1c  Screening, lipid - Plan: Lipid panel   1.  Complete Physical Examination:  Anticipatory guidance --- smoking cessation.  S/p TDAP and influenza vaccines administered in office.  Safe driving and gun practices reviewed. 2.  S/p TDAP 3. S/p flu vaccine. 4.  Vitamin D deficiency: New at [facility] two years ago; repeat today; not current supplementation. 5.  Malaise and fatigue: New.  Obtain labs; no snoring regularly. If persists and labs normal, consider sleep study. 6.  Screening DMII: obtain glucose and HgbA1c. 7. Screening lipid: obtain FLP.   No orders of the defined types were placed in this encounter.   I personally performed the services described in this documentation, which was scribed in my presence.  The recorded information has been reviewed and is accurate.  Nilda Simmer, M.D.  [facility] [address] [place], [place]  [address] [phone] phone [phone] fax

## 2014-09-21 NOTE — Patient Instructions (Signed)

## 2014-09-22 LAB — COMPREHENSIVE METABOLIC PANEL
ALBUMIN: 4.6 g/dL (ref 3.5–5.2)
ALT: 25 U/L (ref 0–53)
AST: 25 U/L (ref 0–37)
Alkaline Phosphatase: 82 U/L (ref 39–117)
BUN: 23 mg/dL (ref 6–23)
CALCIUM: 9.9 mg/dL (ref 8.4–10.5)
CHLORIDE: 102 meq/L (ref 96–112)
CO2: 26 meq/L (ref 19–32)
Creat: 1.18 mg/dL (ref 0.50–1.35)
GLUCOSE: 81 mg/dL (ref 70–99)
Potassium: 5 mEq/L (ref 3.5–5.3)
SODIUM: 137 meq/L (ref 135–145)
Total Bilirubin: 0.9 mg/dL (ref 0.2–1.2)
Total Protein: 7.2 g/dL (ref 6.0–8.3)

## 2014-09-22 LAB — LIPID PANEL
Cholesterol: 165 mg/dL (ref 0–200)
HDL: 60 mg/dL (ref >39)
LDL Cholesterol: 85 mg/dL (ref 0–99)
Total CHOL/HDL Ratio: 2.8 Ratio
Triglycerides: 98 mg/dL (ref <150)
VLDL: 20 mg/dL (ref 0–40)

## 2014-09-22 LAB — HEMOGLOBIN A1C
HEMOGLOBIN A1C: 5.4 % (ref <5.7)
Mean Plasma Glucose: 108 mg/dL (ref <117)

## 2014-09-22 LAB — TSH: TSH: 1.893 u[IU]/mL (ref 0.350–4.500)

## 2014-09-22 LAB — VITAMIN D 25 HYDROXY (VIT D DEFICIENCY, FRACTURES): Vit D, 25-Hydroxy: 33 ng/mL (ref 30–89)

## 2015-10-09 ENCOUNTER — Ambulatory Visit (INDEPENDENT_AMBULATORY_CARE_PROVIDER_SITE_OTHER): Payer: BLUE CROSS/BLUE SHIELD | Admitting: Family Medicine

## 2015-10-09 VITALS — BP 138/88 | HR 63 | Temp 98.2°F | Resp 17 | Ht 71.5 in | Wt 198.0 lb

## 2015-10-09 DIAGNOSIS — Z23 Encounter for immunization: Secondary | ICD-10-CM | POA: Diagnosis not present

## 2015-10-09 DIAGNOSIS — Z131 Encounter for screening for diabetes mellitus: Secondary | ICD-10-CM | POA: Diagnosis not present

## 2015-10-09 DIAGNOSIS — Z13220 Encounter for screening for lipoid disorders: Secondary | ICD-10-CM | POA: Diagnosis not present

## 2015-10-09 DIAGNOSIS — M791 Myalgia, unspecified site: Secondary | ICD-10-CM

## 2015-10-09 LAB — POCT URINALYSIS DIP (MANUAL ENTRY)
BILIRUBIN UA: NEGATIVE
GLUCOSE UA: NEGATIVE
Ketones, POC UA: NEGATIVE
Leukocytes, UA: NEGATIVE
Nitrite, UA: NEGATIVE
Protein Ur, POC: NEGATIVE
RBC UA: NEGATIVE
SPEC GRAV UA: 1.025
Urobilinogen, UA: 0.2
pH, UA: 5.5

## 2015-10-09 LAB — CBC
HEMATOCRIT: 48.9 % (ref 39.0–52.0)
Hemoglobin: 16.7 g/dL (ref 13.0–17.0)
MCH: 30.2 pg (ref 26.0–34.0)
MCHC: 34.2 g/dL (ref 30.0–36.0)
MCV: 88.4 fL (ref 78.0–100.0)
MPV: 10.8 fL (ref 8.6–12.4)
Platelets: 254 10*3/uL (ref 150–400)
RBC: 5.53 MIL/uL (ref 4.22–5.81)
RDW: 13.4 % (ref 11.5–15.5)
WBC: 4.5 10*3/uL (ref 4.0–10.5)

## 2015-10-09 LAB — COMPREHENSIVE METABOLIC PANEL
ALBUMIN: 4.5 g/dL (ref 3.6–5.1)
ALT: 30 U/L (ref 9–46)
AST: 46 U/L — ABNORMAL HIGH (ref 10–40)
Alkaline Phosphatase: 115 U/L (ref 40–115)
BUN: 18 mg/dL (ref 7–25)
CALCIUM: 9.1 mg/dL (ref 8.6–10.3)
CO2: 28 mmol/L (ref 20–31)
Chloride: 101 mmol/L (ref 98–110)
Creat: 1.18 mg/dL (ref 0.60–1.35)
Glucose, Bld: 72 mg/dL (ref 65–99)
Potassium: 4.5 mmol/L (ref 3.5–5.3)
Sodium: 140 mmol/L (ref 135–146)
Total Bilirubin: 0.5 mg/dL (ref 0.2–1.2)
Total Protein: 6.8 g/dL (ref 6.1–8.1)

## 2015-10-09 LAB — C-REACTIVE PROTEIN

## 2015-10-09 LAB — POC MICROSCOPIC URINALYSIS (UMFC): Mucus: ABSENT

## 2015-10-09 LAB — TSH: TSH: 2.305 u[IU]/mL (ref 0.350–4.500)

## 2015-10-09 LAB — CK: CK TOTAL: 670 U/L — AB (ref 7–232)

## 2015-10-09 LAB — LDL CHOLESTEROL, DIRECT: Direct LDL: 78 mg/dL (ref <130)

## 2015-10-09 MED ORDER — CYCLOBENZAPRINE HCL 10 MG PO TABS: 10.0000 mg | ORAL_TABLET | Freq: Two times a day (BID) | ORAL | Status: AC | PRN

## 2015-10-09 NOTE — Patient Instructions (Addendum)
I will be in touch with the rest of your labs However your urine looks fine which is an encouraging sign- I suspect your kidneys are ok In the meantime take it easy, no lifting or heavy exercise If you have any change in your symptoms please let me know  You can use the muscle relaxer as needed for muscle pains - remember this will make you sleepy   Please get your flu shot once you are well and this situation is sorted out

## 2015-10-09 NOTE — Progress Notes (Addendum)
[facility] [address], [place] [address] [id number] [phone]  Date:  10/09/2015   Name:  Colin Rodriguez   DOB:  November 17, 1976   MRN:  [id number]  PCP:  Nilda Simmer, MD    Chief Complaint: arm muscle fatigue and leg muscle fatigue   History of Present Illness:  Colin Rodriguez is a 39 y.o. very pleasant male patient who presents with the following:  Here today with concern regarding pain in his muscles.   Complete labs about one year ago- looked ok,  Vitamin D ok at that time  He awoke 3 days ago with muscle pain in both forearms- this seems to have gotten worse, spread into wrists and hands, and also his calves and shins.  Sx are bilateral.  No tingling, no numbness. He feels like his right arm hurts more than the left. No swelling, redness or heat.  Last night he slept poorly- sx are a bit better today  No unusual activities.  He does exercise a lot, but is not aware of anything new in his routine.   He does take creatine- was not sure if he was drinking enough water. Drank a lot of water last night- it did not help  No fever No cough, ST, abd pain. No change in urine.  No dark urine He is not aware of any change in his activity really No steroid use- no testosterone product use He does use protein/ creatinine supplement but nothing that sounds suspicious   He has tried some ibuprofen for pain- it helped a little bit   Patient Active Problem List   Diagnosis Date Noted  . Asthma 12/12/2012  . Hypovitaminosis D 12/12/2012    Past Medical History  Diagnosis Date  . Asthma     CHILDHOOD; RARE INHALER USE.    Past Surgical History  Procedure Laterality Date  . Hernia repair      infant  . Wisdom tooth extraction      Social History  Substance Use Topics  . Smoking status: Current Some Day Smoker -- 0.25 packs/day for 15 years    Last Attempt to Quit: 05/29/2013  . Smokeless tobacco: Never Used  . Alcohol Use: 2.5 - 3.0 oz/week    5-6  drink(s) per week    Family History  Problem Relation Age of Onset  . Hypertension Mother   . Cancer Maternal Grandmother     throat  . Cancer Father     skin cancer    No Known Allergies  Medication list has been reviewed and updated.  No current outpatient prescriptions on file prior to visit.   Current Facility-Administered Medications on File Prior to Visit  Medication Dose Route Frequency Provider Last Rate Last Dose  . gi cocktail (Maalox,Lidocaine,Donnatal)  30 mL Oral Once Jonita Albee, MD        Review of Systems:  As per HPI- otherwise negative.   Physical Examination: Filed Vitals:   10/09/15 0816  BP: 138/88  Pulse: 63  Temp: 98.2 F (36.8 C)  Resp: 17   Filed Vitals:   10/09/15 0816  Height: 5' 11.5" (1.816 m)  Weight: 198 lb (89.812 kg)   Body mass index is 27.23 kg/(m^2). Ideal Body Weight: Weight in (lb) to have BMI = 25: 181.4  GEN: WDWN, NAD, Non-toxic, A & O x 3, looks well, muscular/ fit build with a lot of tattoos on arms HEENT: Atraumatic, Normocephalic. Neck supple. No masses, No LAD.  Bilateral TM wnl, oropharynx normal.  PEERL,EOMI.   Ears and Nose: No external deformity. CV: RRR, No M/G/R. No JVD. No thrill. No extra heart sounds. PULM: CTA B, no wheezes, crackles, rhonchi. No retractions. No resp. distress. No accessory muscle use. ABD: S, NT, ND. No rebound. No HSM.  Benign belly EXTR: No c/c/e NEURO Normal gait.  PSYCH: Normally interactive. Conversant. Not depressed or anxious appearing.  Calm demeanor.  Mild palpable tenderness of the muscles of the forearms. Right more than left Otherwise muscles are not tender to touch No heat or swelling noted   Results for orders placed or performed in visit on 10/09/15  POCT urinalysis dipstick  Result Value Ref Range   Color, UA yellow yellow   Clarity, UA clear clear   Glucose, UA negative negative   Bilirubin, UA negative negative   Ketones, POC UA negative negative   Spec Grav,  UA 1.025    Blood, UA negative negative   pH, UA 5.5    Protein Ur, POC negative negative   Urobilinogen, UA 0.2    Nitrite, UA Negative Negative   Leukocytes, UA Negative Negative  POCT Microscopic Urinalysis ([facility])  Result Value Ref Range   WBC,UR,HPF,POC None None WBC/hpf   RBC,UR,HPF,POC None None RBC/hpf   Bacteria None None   Mucus Absent Absent   Epithelial Cells, UR Per Microscopy None None cells/hpf    Assessment and Plan: Muscle pain - Plan: CBC, Comprehensive metabolic panel, TSH, POCT urinalysis dipstick, POCT Microscopic Urinalysis ([facility]), CK, Sedimentation Rate, C-reactive protein, cyclobenzaprine (FLEXERIL) 10 MG tablet  Need for prophylactic vaccination and inoculation against influenza - Plan: Flu Vaccine QUAD 36+ mos IM  Screening for diabetes mellitus - Plan: Comprehensive metabolic panel  Screening for hyperlipidemia - Plan: LDL cholesterol, direct  Here today with muscle pain. He does exercise a lot- suspect that his CK will be high.  However urine is reassuring that he does not have rhabdomyolysis.  He also looks well overall Advised relative rest, hydration,  Conservative use of NSIAIDs and tylenol as needed Muscle relaxer for more severe pain Will plan further follow- up pending labs.  Defer flu shot today- will do once well  Signed Abbe Amsterdam, MD  Called to check on him on 10/13- he reports that he feels fine.  His sx are resolved.  His daughter did have hand foot and mouth- he wonders if this could be related.  In any case he will come in for repeat CK and Liver function in 2 weeks or so

## 2015-10-10 LAB — SEDIMENTATION RATE: Sed Rate: 1 mm/hr (ref 0–15)

## 2015-10-10 NOTE — Addendum Note (Signed)
Addended by: Abbe Amsterdam C on: 10/10/2015 08:38 AM   Modules accepted: Orders

## 2015-10-12 NOTE — Addendum Note (Signed)
Addended by: Abbe AmsterdamOPLAND, Jkwon Treptow C on: 10/12/2015 08:10 PM   Modules accepted: Orders

## 2016-01-16 ENCOUNTER — Ambulatory Visit: Payer: 59 | Attending: Internal Medicine | Admitting: Audiology

## 2016-01-16 DIAGNOSIS — H9325 Central auditory processing disorder: Secondary | ICD-10-CM

## 2016-01-16 DIAGNOSIS — R292 Abnormal reflex: Secondary | ICD-10-CM | POA: Diagnosis present

## 2016-01-16 DIAGNOSIS — H748X3 Other specified disorders of middle ear and mastoid, bilateral: Secondary | ICD-10-CM

## 2016-01-16 DIAGNOSIS — H906 Mixed conductive and sensorineural hearing loss, bilateral: Secondary | ICD-10-CM | POA: Diagnosis present

## 2016-01-16 DIAGNOSIS — H93293 Other abnormal auditory perceptions, bilateral: Secondary | ICD-10-CM | POA: Diagnosis present

## 2016-01-16 DIAGNOSIS — H748X9 Other specified disorders of middle ear and mastoid, unspecified ear: Secondary | ICD-10-CM

## 2016-01-16 DIAGNOSIS — H9193 Unspecified hearing loss, bilateral: Secondary | ICD-10-CM

## 2016-01-16 NOTE — Patient Instructions (Signed)
  Organization is associated with poor sequencing ability and lacking natural orderliness.  Difficulties are usually seen in oral and written discourse, sound-symbol relationships, sequencing thoughts, and difficulties with thought organization and clarification. Letter reversals (e.g. b/d) and word reversals are often noted.  In severe cases, reversal in syntax may be found. The sequencing problems are frequently also noted in modalities other than auditory such as visual or motor planning for speech and/or actions.

## 2016-01-16 NOTE — Procedures (Signed)
Outpatient Audiology and [facility] [address] [place], [place]  [address] [phone]  AUDIOLOGICAL AND AUDITORY PROCESSING EVALUATION  NAME: Colin Rodriguez  STATUS: Outpatient DOB:   1976-01-05   DIAGNOSIS: Evaluate for Central auditory                                                                                    processing disorder  MRN: [id number]                                                                                      DATE: 01/16/2016   REFERENT: Dr. Marisue Brooklyn  HISTORY: [name] ,  was seen for an audiological and central auditory processing evaluation.  [name] is seen today to evaluate auditory processing.  He works in Airline pilot and notes that he sometimes has difficulty following lengthy conversations.  He states that his "40 year old daughter is having difficulty similar" to his experience as a young child.  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  [name] states that he has "developed coping strategies", but that he "is very distractible" and is "very tired at the end of the day".   [name] denies having ear infections but does report significant difficulty getting his "ears to equalize when flying" - "sometimes his ears are very painful before they finally equalize".  He denies tinnitus or balance problems but does report an episode "of inner ear problems" that lasted a few days several years ago.  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 Other than this there is no reported significant noise exposure.  He denies having a head or ear injury. There is no reported history of hearing loss in childhood. Loranzo is the "father of three children" and reports that sometimes they seem "very loud".   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  The mid range hearing has normal to a slight mixed hearing loss bilaterally. His perception of uncomfortable loudness suggests slight sound sensitivity by history that is supported by today's testing - monitoring is needed to rule out recruitment.  Speech reception thresholds are 20 dBHL on the left and 25 dBHL on the right using recorded spondee word lists. Word recognition was 100% at 60 dBHL in each ear using recorded NU-6 word lists, in quiet.  Otoscopic inspection reveals clear ear canals with visible tympanic membranes.  Tympanometry showed normal middle ear volume and pressure with excessive compliance bilaterally that is worse on the left side (Type Add). Significant is that the ipsilateral acoustic reflexes are absent bilaterally from 500Hz  - 4000Hz .  Distortion Product Otoacoustic  Emissions (DPOAE) testing showed absent responses in each ear, which may not be significant since these responses are adversely affected by abnormal middle ear function and excessive tympanic membrane compliance.    A summary of Kalei T Lacina's central auditory processing evaluation is as follows:  Speech-in-Noise testing was performed to determine speech discrimination in the presence of background noise.  40 year scored 90% in the right ear and 84 % in the left ear, when noise was presented 5 dB below speech, which is within normal limits bilaterally.  The Phonemic Synthesis test was administered to assess decoding and sound blending skills through word reception.  Milford's qualitative score was 21 correct which indicates a slight decoding and sound-blending deficit, even in quiet.    The Staggered Spondaic Word Test [facility]) was also administered.  This test uses spondee words (familiar words consisting of two monosyllabic words with equal stress on each word) as the test stimuli.  Different words are directed to each ear, competing and non-competing.  Vint had  has a slight but significant central auditory processing disorder (CAPD) in the areas of decoding   Random Gap Detection test (RGDT- a revised AFT-R) was administered to measure temporal processing of minute timing differences. Wilberto scored within normal limits with 5-10 msec detection.   Competing Sentences (CS) involved a different sentences being presented to each ear at different volumes. The instructions are to repeat the softer volume sentences. Posterior temporal issues will show poorer performance in the ear contralateral to the lobe involved.  Davionne scored 100% in the right ear and 100% in the left ear, which is within normal limits bilaterally.  Dichotic Digits (DD) presents different two digits to each ear. All four digits are to be repeated. Poor performance suggests binaural interaction difficulty. Anwar reported finding this test difficult.  He scored 80% in the right ear and 72.5% in the left ear. The test results indicate that Bassem scored abnormal in each ear, poorer on the left side. The results are consistent with Central Auditory Processing Disorder. (The cut off for this test is 90% in each ear).  Musiek's Frequency (Pitch) Pattern Test requires identification of high and low pitch tones presented each ear individually. Poor performance may occur with organization, learning issues or dyslexia.  Jemmie scored 100% in each ear which is normal on this auditory processing test.   Summary of Robertlee's areas of difficulty: Slight Decoding deficit with phonemic processing.  It's an inability to sound out words or difficulty associating written letters with the sounds they represent.  Decoding problems are in difficulties with reading accuracy, oral discourse, phonics and spelling, articulation, receptive language, and understanding directions.  Oral discussions and written tests are particularly difficult. This makes it difficult to understand what is said because the sounds are  not readily recognized or because people speak too rapidly.  It may be possible to follow slow, simple or repetitive material, but difficult to keep up with a fast speaker as well as new or abstract material.  Organization is associated with poor sequencing ability and lacking natural orderliness.  Difficulties are usually seen in oral and written discourse, sound-symbol relationships, sequencing thoughts, and difficulties with thought organization and clarification. Letter reversals (e.g. b/d) and word reversals are often noted.  In severe cases, reversal in syntax may be found. The sequencing problems are frequently also noted in modalities other than auditory such as visual or motor planning for speech and/or actions.  CONCLUSIONS: ZYMIRE TURNBO has a few areas that  need to be discussed.  First of all he has several abnormal audiological findings that will most likely compound his auditory processing and hearing.  He has a mild low and high frequency hearing loss that has a sensorineural component in the low frequencies and a conductive component in the high frequencies. His mid range hearing threshold range between normal hearing and a slight hearing loss but there is a significant conductive component.  Oziah T Wingate's middle ear function shows excessive compliance bilaterally (more on the left side) which is consistent with Gerlene Burdock T Yousuf's report that he often has "difficulty having his ears equalize" to pressure while flying and that he "has never been able to swim/drive deep in water".   The acoustic reflexes and inner ear function are abnormal/absent -  this is most likely an artifact from the abnormal middle ear function but will require monitoring.  As discussed with [name], further evaluation by an Ear, Nose and Throat physician may be beneficial because improved hearing thresholds would help hearing of softer sounds which would benefit auditory processing.  In addition, since  WINSLOW EDERER "fly's every few weeks" and he reports that the "pressure changes cause ear pain", further evaluation by an ENT may provide him with relief.   Generoso Romelle Starcher showed considerable concentration and focus during the auditory processing portion of testing.  His compensation strategies are effective. Antonino reported how he quickly evaluated the test presentation and developed a strategy to complete the task. However, a qualitative difference in the ease of his responses during testing was observed.   Prophet Romelle Starcher appeared to exert more effort during these auditory tasks than is required by most adults.   [name] has excellent word recognition in quiet and in minimal background noise in each ear.  Two auditory processing test batteries were administered today [facility] [place]). Leanard had findings positive for having a Airline pilot Disorder (CAPD) on each of them. Vineeth Romelle Starcher has slight but significant CAPD in the areas of Organization and Decoding.  The organization finding is a "red flag" that an underlying learning issue/dyslexia is suspect. Surprising is that Damion's decoding issues was found during sound blending skills, in quiet.  He had delays and missed some responses. Most striking was during the final CAPD test when Tryson quit responding for two presentations and I needed to call his name - he quickly responded "That is the ADD" and started responding appropriately again.  [name] found one test particularly difficult - when two numbers were presented to each ear, he had great difficulty trying to repeat them and scored abnormal in each ear.   By history, [name] reported that "math" was not his favorite subject in school. When a similar task was presented using sentences, he scored well within normal limits in each ear.   Daryl Romelle Starcher scored within normal limits for the temporal processing tasks of pitch and  timing perception. As discussed with him, the years of music training and playing have been beneficial to him.  According to current result  learning to play a musical instrument results in improved neurological function related to auditory processing that benefits decoding, dyslexia and hearing in background noise (for further information, please refer to the following website for further info: www.brainvolts at [facility], Davonna Belling, PhD).   In summary, although DEVONN GIAMPIETRO scored within normal limits for several tests administered today - he shows a slight but significant  Central Auditory Processing Disorder. It will important for [name] to  1) get adequate physical and auditory rest as fatigue adversely affects CAPD.  2)  use of technology to help with auditory processing. This may be using apps on a tablet,  a recording device or using a live scribe smart pen to supplement auditory processing  A live scribe pen records while taking notes. If Andoni makes a mark (asteric or star) when he wants to remember an important detail, touching that mark immediately returns the recording place to that place so that he may listen again to clarify or obtain additional information and 3) It will be important to encourage optimal hearing as well as hearing protection when around loud sounds.   RECOMMENDATIONS: 1.  [name] needs to closely monitor his hearing because of mixed hearing loss bilaterally - this may be completed here or with an ENT. 2.  Further evaluation by an Ear, Nose and Throat (ENT) is recommended because of the ear discomfort that he reports during his work related frequent flying and the mixed hearing loss. 3. Please be aware that low level hearing aids may be used to enhance auditory processing. Since MAHIN GUARDIA has a highly socially interactive job, this may be a consideration.  Audiologists with expertise fitting hearing aids for CAPD include Sidney Ace, AuD in [place] [facility] [email or web address]) or Royden Purl, AuD in [place] [email or web address]).  4. 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   Decoding of speech and speech sounds should occur quickly and accurately.  Improvement in decoding is often addressed first because improvement here, helps hearing in background noise and other areas. Auditory processing self-help computer programs are available for IPAD and computer download.  Benefit has been shown with intensive use for 10-15 minutes,  4-5 days per week. Research is suggesting that using the programs for a short amount of time each day is better for the auditory processing development than completing the program in a short amount of time by doing it several hours per day.  Consider using the following program for adults for 4-6 weeks (or until completed).   LACE (download or cd)  www.neurotone.com 5. Other self-help measures include: 1) have conversation face to face  2) minimize background noise when having a conversation- turn off the TV, move to a quiet area of the area 3) be aware that auditory processing problems become worse with fatigue and stress-include periods of auditory rest during the day  4) Avoid having important conversation with Bland's back to the speaker. 5) Continue to learn music ( Current research strongly indicates that learning to play a musical instrument results in improved neurological function related to auditory processing that benefits decoding, dyslexia and hearing in background noise.  Please refer to the following website for further info: www.brainvolts at [facility], Davonna Belling, PhD.)   6. Use hearing protection during noise exposure.  Musician's plugs, are available from [employer].com for music related hearing protection because of no/minimal distortion.  Other hearing protection, such as sponge plugs (available at pharmacies) or earmuffs  (available at [employer] or [employer]) are useful for noisy activities and venues. 7.  As mentioned in the report, a learning issue is suspect. Please discuss with Dr. Elisabeth Most whether additional testing of learning or  Strategies or treatment of the known areas of weakness will be best.   Gavin Pound L. Kate Sable, AuD, CCC-A 01/16/2016

## 2019-09-16 ENCOUNTER — Other Ambulatory Visit: Payer: Self-pay

## 2019-09-16 DIAGNOSIS — Z20822 Contact with and (suspected) exposure to covid-19: Secondary | ICD-10-CM

## 2019-09-18 LAB — NOVEL CORONAVIRUS, NAA: SARS-CoV-2, NAA: NOT DETECTED

## 2020-01-26 ENCOUNTER — Ambulatory Visit: Payer: 59 | Attending: Internal Medicine

## 2020-01-26 DIAGNOSIS — Z20822 Contact with and (suspected) exposure to covid-19: Secondary | ICD-10-CM

## 2020-01-27 LAB — NOVEL CORONAVIRUS, NAA: SARS-CoV-2, NAA: NOT DETECTED

## 2020-03-05 ENCOUNTER — Ambulatory Visit: Payer: 59 | Attending: Internal Medicine

## 2020-03-05 DIAGNOSIS — Z23 Encounter for immunization: Secondary | ICD-10-CM

## 2020-03-05 NOTE — Progress Notes (Signed)
   Covid-19 Vaccination Clinic  Name:  Colin Rodriguez    MRN: [id number] DOB: June 21, 1976  03/05/2020  [name] was observed post Covid-19 immunization for 15 minutes without incident. He was provided with Vaccine Information Sheet and instruction to access the V-Safe system.   [name] was instructed to call 911 with any severe reactions post vaccine: Marland Kitchen Difficulty breathing  . Swelling of face and throat  . A fast heartbeat  . A bad rash all over body  . Dizziness and weakness   Immunizations Administered    Name Date Dose VIS Date Route   Pfizer COVID-19 Vaccine 03/05/2020  4:23 PM 0.3 mL 12/10/2019 Intramuscular   Manufacturer: [employer], [employer]   Lot: [id number]   NDC: [address]-5

## 2020-03-25 ENCOUNTER — Ambulatory Visit: Payer: 59 | Attending: Internal Medicine

## 2020-03-25 DIAGNOSIS — Z23 Encounter for immunization: Secondary | ICD-10-CM

## 2020-03-25 NOTE — Progress Notes (Signed)
   Covid-19 Vaccination Clinic  Name:  Colin Rodriguez    MRN: [id number] DOB: 02-12-1976  03/25/2020  Colin Rodriguez was observed post Covid-19 immunization for 15 minutes without incident. He was provided with Vaccine Information Sheet and instruction to access the V-Safe system.   Colin Rodriguez was instructed to call 911 with any severe reactions post vaccine: Marland Kitchen Difficulty breathing  . Swelling of face and throat  . A fast heartbeat  . A bad rash all over body  . Dizziness and weakness   Immunizations Administered    Name Date Dose VIS Date Route   Pfizer COVID-19 Vaccine 03/25/2020  3:42 PM 0.3 mL 12/10/2019 Intramuscular   Manufacturer: [employer], [employer]   Lot: [id number]   NDC: [address]-9

## 2020-04-11 ENCOUNTER — Ambulatory Visit: Payer: 59
